# Patient Record
Sex: Female | Born: 1950 | Race: Black or African American | Hispanic: No | Marital: Married
Health system: Southern US, Community
[De-identification: ages and names within clinical notes are randomized; demographics above are authoritative.]

---

## 2006-09-08 ENCOUNTER — Encounter: Admission: RE | Admit: 2006-09-08 | Discharge: 2006-09-08 | Payer: Self-pay | Admitting: Internal Medicine

## 2007-06-23 ENCOUNTER — Encounter: Admission: RE | Admit: 2007-06-23 | Discharge: 2007-06-23 | Payer: Self-pay | Admitting: Internal Medicine

## 2009-05-30 ENCOUNTER — Encounter: Admission: RE | Admit: 2009-05-30 | Discharge: 2009-05-30 | Payer: Self-pay | Admitting: Neurosurgery

## 2010-04-12 ENCOUNTER — Inpatient Hospital Stay (HOSPITAL_COMMUNITY)
Admission: AD | Admit: 2010-04-12 | Discharge: 2010-04-17 | Disposition: A | Discharge status: Rehabilitation Facility | Payer: Self-pay | Admitting: Neurosurgery

## 2010-04-16 ENCOUNTER — Ambulatory Visit: Payer: Self-pay | Admitting: Physical Medicine & Rehabilitation

## 2010-04-17 ENCOUNTER — Inpatient Hospital Stay (HOSPITAL_COMMUNITY)
Admission: RE | Admit: 2010-04-17 | Discharge: 2010-04-20 | Payer: Self-pay | Admitting: Physical Medicine & Rehabilitation

## 2010-07-12 ENCOUNTER — Other Ambulatory Visit (HOSPITAL_COMMUNITY): Payer: Self-pay | Admitting: Specialist

## 2010-07-30 LAB — DIFFERENTIAL
Basophils Relative: 0 % (ref 0–1)
Eosinophils Relative: 3 % (ref 0–5)
Lymphocytes Relative: 35 % (ref 12–46)
Neutro Abs: 6.6 10*3/uL (ref 1.7–7.7)

## 2010-07-30 LAB — COMPREHENSIVE METABOLIC PANEL
ALT: 34 U/L (ref 0–35)
AST: 20 U/L (ref 0–37)
BUN: 14 mg/dL (ref 6–23)
Creatinine, Ser: 0.74 mg/dL (ref 0.4–1.2)
GFR calc Af Amer: >60 mL/min (ref >60)

## 2010-07-30 LAB — GLUCOSE, CAPILLARY
Glucose-Capillary: 121 mg/dL — ABNORMAL HIGH (ref 70–99)
Glucose-Capillary: 132 mg/dL — ABNORMAL HIGH (ref 70–99)
Glucose-Capillary: 133 mg/dL — ABNORMAL HIGH (ref 70–99)
Glucose-Capillary: 93 mg/dL (ref 70–99)

## 2010-07-30 LAB — CBC
HCT: 37.8 % (ref 36.0–46.0)
HCT: 40.7 % (ref 36.0–46.0)
Hemoglobin: 13.1 g/dL (ref 12.0–15.0)
MCHC: 32.3 g/dL (ref 30.0–36.0)
MCV: 86.8 fL (ref 78.0–100.0)
Platelets: 378 10*3/uL (ref 150–400)
RDW: 14.7 % (ref 11.5–15.5)
WBC: 11.8 10*3/uL — ABNORMAL HIGH (ref 4.0–10.5)

## 2010-07-30 LAB — MRSA PCR SCREENING: MRSA by PCR: NEGATIVE

## 2010-11-21 ENCOUNTER — Other Ambulatory Visit: Payer: Self-pay | Admitting: Neurosurgery

## 2010-11-21 DIAGNOSIS — M4712 Other spondylosis with myelopathy, cervical region: Secondary | ICD-10-CM

## 2010-11-26 ENCOUNTER — Ambulatory Visit
Admission: RE | Admit: 2010-11-26 | Discharge: 2010-11-26 | Disposition: A | Discharge status: Home / Self Care | Payer: BC Managed Care – PPO | Source: Ambulatory Visit | Attending: Neurosurgery | Admitting: Neurosurgery

## 2010-11-26 DIAGNOSIS — M4712 Other spondylosis with myelopathy, cervical region: Secondary | ICD-10-CM

## 2015-06-06 DIAGNOSIS — E559 Vitamin D deficiency, unspecified: Secondary | ICD-10-CM | POA: Diagnosis not present

## 2015-06-06 DIAGNOSIS — R5383 Other fatigue: Secondary | ICD-10-CM | POA: Diagnosis not present

## 2015-06-06 DIAGNOSIS — Z79899 Other long term (current) drug therapy: Secondary | ICD-10-CM | POA: Diagnosis not present

## 2015-06-06 DIAGNOSIS — E7800 Pure hypercholesterolemia, unspecified: Secondary | ICD-10-CM | POA: Diagnosis not present

## 2016-03-31 ENCOUNTER — Other Ambulatory Visit: Payer: Self-pay | Admitting: Nurse Practitioner

## 2016-03-31 DIAGNOSIS — N6310 Unspecified lump in the right breast, unspecified quadrant: Secondary | ICD-10-CM

## 2016-04-04 ENCOUNTER — Ambulatory Visit
Admission: RE | Admit: 2016-04-04 | Discharge: 2016-04-04 | Disposition: A | Discharge status: Home / Self Care | Payer: Medicare PPO | Source: Ambulatory Visit | Attending: Nurse Practitioner | Admitting: Nurse Practitioner

## 2016-04-04 DIAGNOSIS — N6310 Unspecified lump in the right breast, unspecified quadrant: Secondary | ICD-10-CM

## 2016-06-10 ENCOUNTER — Other Ambulatory Visit: Payer: Self-pay | Admitting: Internal Medicine

## 2016-06-10 DIAGNOSIS — Z1231 Encounter for screening mammogram for malignant neoplasm of breast: Secondary | ICD-10-CM

## 2016-06-26 ENCOUNTER — Ambulatory Visit
Admission: RE | Admit: 2016-06-26 | Discharge: 2016-06-26 | Disposition: A | Discharge status: Home / Self Care | Payer: Medicare PPO | Source: Ambulatory Visit | Attending: Internal Medicine | Admitting: Internal Medicine

## 2016-06-26 DIAGNOSIS — Z1231 Encounter for screening mammogram for malignant neoplasm of breast: Secondary | ICD-10-CM

## 2016-08-14 DIAGNOSIS — E2839 Other primary ovarian failure: Secondary | ICD-10-CM | POA: Diagnosis not present

## 2016-10-07 DIAGNOSIS — K5900 Constipation, unspecified: Secondary | ICD-10-CM | POA: Diagnosis not present

## 2016-10-09 DIAGNOSIS — M5430 Sciatica, unspecified side: Secondary | ICD-10-CM | POA: Diagnosis not present

## 2016-10-09 DIAGNOSIS — M791 Myalgia: Secondary | ICD-10-CM | POA: Diagnosis not present

## 2016-10-15 DIAGNOSIS — Z7982 Long term (current) use of aspirin: Secondary | ICD-10-CM | POA: Diagnosis not present

## 2016-10-15 DIAGNOSIS — E7800 Pure hypercholesterolemia, unspecified: Secondary | ICD-10-CM | POA: Diagnosis not present

## 2016-10-15 DIAGNOSIS — Z8249 Family history of ischemic heart disease and other diseases of the circulatory system: Secondary | ICD-10-CM | POA: Diagnosis not present

## 2016-10-15 DIAGNOSIS — M47812 Spondylosis without myelopathy or radiculopathy, cervical region: Secondary | ICD-10-CM | POA: Diagnosis not present

## 2016-10-15 DIAGNOSIS — Z886 Allergy status to analgesic agent status: Secondary | ICD-10-CM | POA: Diagnosis not present

## 2016-10-15 DIAGNOSIS — I10 Essential (primary) hypertension: Secondary | ICD-10-CM | POA: Diagnosis not present

## 2016-10-15 DIAGNOSIS — Z79899 Other long term (current) drug therapy: Secondary | ICD-10-CM | POA: Diagnosis not present

## 2016-10-15 DIAGNOSIS — K5900 Constipation, unspecified: Secondary | ICD-10-CM | POA: Diagnosis not present

## 2016-11-03 DIAGNOSIS — H2513 Age-related nuclear cataract, bilateral: Secondary | ICD-10-CM | POA: Diagnosis not present

## 2016-11-03 DIAGNOSIS — H52209 Unspecified astigmatism, unspecified eye: Secondary | ICD-10-CM | POA: Diagnosis not present

## 2016-11-03 DIAGNOSIS — H3550 Unspecified hereditary retinal dystrophy: Secondary | ICD-10-CM | POA: Diagnosis not present

## 2016-11-05 DIAGNOSIS — K5900 Constipation, unspecified: Secondary | ICD-10-CM | POA: Diagnosis not present

## 2017-05-25 ENCOUNTER — Other Ambulatory Visit: Payer: Self-pay | Admitting: Internal Medicine

## 2017-05-25 DIAGNOSIS — Z1231 Encounter for screening mammogram for malignant neoplasm of breast: Secondary | ICD-10-CM

## 2017-06-15 ENCOUNTER — Ambulatory Visit
Admission: RE | Admit: 2017-06-15 | Discharge: 2017-06-15 | Disposition: A | Discharge status: Home / Self Care | Payer: Medicare Other | Source: Ambulatory Visit | Attending: Internal Medicine | Admitting: Internal Medicine

## 2017-06-15 DIAGNOSIS — Z1231 Encounter for screening mammogram for malignant neoplasm of breast: Secondary | ICD-10-CM

## 2017-06-16 ENCOUNTER — Other Ambulatory Visit: Payer: Self-pay | Admitting: Internal Medicine

## 2017-06-16 DIAGNOSIS — N644 Mastodynia: Secondary | ICD-10-CM

## 2017-06-17 DIAGNOSIS — Z2821 Immunization not carried out because of patient refusal: Secondary | ICD-10-CM | POA: Diagnosis not present

## 2017-06-17 DIAGNOSIS — Z789 Other specified health status: Secondary | ICD-10-CM | POA: Diagnosis not present

## 2017-06-17 DIAGNOSIS — I10 Essential (primary) hypertension: Secondary | ICD-10-CM | POA: Diagnosis not present

## 2017-06-17 DIAGNOSIS — Z299 Encounter for prophylactic measures, unspecified: Secondary | ICD-10-CM | POA: Diagnosis not present

## 2017-06-17 DIAGNOSIS — E7800 Pure hypercholesterolemia, unspecified: Secondary | ICD-10-CM | POA: Diagnosis not present

## 2017-06-17 DIAGNOSIS — M542 Cervicalgia: Secondary | ICD-10-CM | POA: Diagnosis not present

## 2017-06-17 DIAGNOSIS — Z6836 Body mass index (BMI) 36.0-36.9, adult: Secondary | ICD-10-CM | POA: Diagnosis not present

## 2017-07-02 DIAGNOSIS — Z1339 Encounter for screening examination for other mental health and behavioral disorders: Secondary | ICD-10-CM | POA: Diagnosis not present

## 2017-07-02 DIAGNOSIS — Z1231 Encounter for screening mammogram for malignant neoplasm of breast: Secondary | ICD-10-CM | POA: Diagnosis not present

## 2017-07-02 DIAGNOSIS — Z6835 Body mass index (BMI) 35.0-35.9, adult: Secondary | ICD-10-CM | POA: Diagnosis not present

## 2017-07-02 DIAGNOSIS — Z7189 Other specified counseling: Secondary | ICD-10-CM | POA: Diagnosis not present

## 2017-07-02 DIAGNOSIS — Z1211 Encounter for screening for malignant neoplasm of colon: Secondary | ICD-10-CM | POA: Diagnosis not present

## 2017-07-02 DIAGNOSIS — Z299 Encounter for prophylactic measures, unspecified: Secondary | ICD-10-CM | POA: Diagnosis not present

## 2017-07-02 DIAGNOSIS — Z0000 Encounter for general adult medical examination without abnormal findings: Secondary | ICD-10-CM | POA: Diagnosis not present

## 2017-07-02 DIAGNOSIS — Z79899 Other long term (current) drug therapy: Secondary | ICD-10-CM | POA: Diagnosis not present

## 2017-07-02 DIAGNOSIS — I10 Essential (primary) hypertension: Secondary | ICD-10-CM | POA: Diagnosis not present

## 2017-07-02 DIAGNOSIS — Z1331 Encounter for screening for depression: Secondary | ICD-10-CM | POA: Diagnosis not present

## 2017-07-06 DIAGNOSIS — M961 Postlaminectomy syndrome, not elsewhere classified: Secondary | ICD-10-CM | POA: Diagnosis not present

## 2017-07-06 DIAGNOSIS — Z981 Arthrodesis status: Secondary | ICD-10-CM | POA: Diagnosis not present

## 2017-07-06 DIAGNOSIS — M50321 Other cervical disc degeneration at C4-C5 level: Secondary | ICD-10-CM | POA: Diagnosis not present

## 2017-07-08 ENCOUNTER — Ambulatory Visit
Admission: RE | Admit: 2017-07-08 | Discharge: 2017-07-08 | Disposition: A | Discharge status: Home / Self Care | Payer: Medicare Other | Source: Ambulatory Visit | Attending: Internal Medicine | Admitting: Internal Medicine

## 2017-07-08 ENCOUNTER — Other Ambulatory Visit: Payer: Self-pay | Admitting: Internal Medicine

## 2017-07-08 ENCOUNTER — Ambulatory Visit: Payer: Medicare Other

## 2017-07-08 DIAGNOSIS — N644 Mastodynia: Secondary | ICD-10-CM

## 2017-07-08 DIAGNOSIS — R928 Other abnormal and inconclusive findings on diagnostic imaging of breast: Secondary | ICD-10-CM | POA: Diagnosis not present

## 2017-07-14 DIAGNOSIS — M542 Cervicalgia: Secondary | ICD-10-CM | POA: Diagnosis not present

## 2017-07-14 DIAGNOSIS — M5030 Other cervical disc degeneration, unspecified cervical region: Secondary | ICD-10-CM | POA: Diagnosis not present

## 2017-07-20 DIAGNOSIS — Z981 Arthrodesis status: Secondary | ICD-10-CM | POA: Diagnosis not present

## 2017-08-22 DIAGNOSIS — M545 Low back pain: Secondary | ICD-10-CM | POA: Diagnosis not present

## 2017-09-29 DIAGNOSIS — Z299 Encounter for prophylactic measures, unspecified: Secondary | ICD-10-CM | POA: Diagnosis not present

## 2017-09-29 DIAGNOSIS — M545 Low back pain: Secondary | ICD-10-CM | POA: Diagnosis not present

## 2017-09-29 DIAGNOSIS — I700 Atherosclerosis of aorta: Secondary | ICD-10-CM | POA: Diagnosis not present

## 2017-09-29 DIAGNOSIS — M5386 Other specified dorsopathies, lumbar region: Secondary | ICD-10-CM | POA: Diagnosis not present

## 2017-09-29 DIAGNOSIS — R7989 Other specified abnormal findings of blood chemistry: Secondary | ICD-10-CM | POA: Diagnosis not present

## 2017-09-29 DIAGNOSIS — I10 Essential (primary) hypertension: Secondary | ICD-10-CM | POA: Diagnosis not present

## 2017-09-29 DIAGNOSIS — M47816 Spondylosis without myelopathy or radiculopathy, lumbar region: Secondary | ICD-10-CM | POA: Diagnosis not present

## 2017-09-29 DIAGNOSIS — M549 Dorsalgia, unspecified: Secondary | ICD-10-CM | POA: Diagnosis not present

## 2017-09-29 DIAGNOSIS — Z6836 Body mass index (BMI) 36.0-36.9, adult: Secondary | ICD-10-CM | POA: Diagnosis not present

## 2017-10-08 DIAGNOSIS — G514 Facial myokymia: Secondary | ICD-10-CM | POA: Diagnosis not present

## 2017-12-23 DIAGNOSIS — H35363 Drusen (degenerative) of macula, bilateral: Secondary | ICD-10-CM | POA: Diagnosis not present

## 2017-12-23 DIAGNOSIS — H524 Presbyopia: Secondary | ICD-10-CM | POA: Diagnosis not present

## 2017-12-23 DIAGNOSIS — D3132 Benign neoplasm of left choroid: Secondary | ICD-10-CM | POA: Diagnosis not present

## 2017-12-23 DIAGNOSIS — H5203 Hypermetropia, bilateral: Secondary | ICD-10-CM | POA: Diagnosis not present

## 2017-12-23 DIAGNOSIS — G514 Facial myokymia: Secondary | ICD-10-CM | POA: Diagnosis not present

## 2017-12-23 DIAGNOSIS — H43813 Vitreous degeneration, bilateral: Secondary | ICD-10-CM | POA: Diagnosis not present

## 2017-12-23 DIAGNOSIS — H52221 Regular astigmatism, right eye: Secondary | ICD-10-CM | POA: Diagnosis not present

## 2018-02-28 DIAGNOSIS — J069 Acute upper respiratory infection, unspecified: Secondary | ICD-10-CM | POA: Diagnosis not present

## 2018-02-28 DIAGNOSIS — J3089 Other allergic rhinitis: Secondary | ICD-10-CM | POA: Diagnosis not present

## 2018-02-28 DIAGNOSIS — J019 Acute sinusitis, unspecified: Secondary | ICD-10-CM | POA: Diagnosis not present

## 2018-02-28 DIAGNOSIS — H6121 Impacted cerumen, right ear: Secondary | ICD-10-CM | POA: Diagnosis not present

## 2018-03-24 DIAGNOSIS — Z6836 Body mass index (BMI) 36.0-36.9, adult: Secondary | ICD-10-CM | POA: Diagnosis not present

## 2018-03-24 DIAGNOSIS — E7800 Pure hypercholesterolemia, unspecified: Secondary | ICD-10-CM | POA: Diagnosis not present

## 2018-03-24 DIAGNOSIS — Z2821 Immunization not carried out because of patient refusal: Secondary | ICD-10-CM | POA: Diagnosis not present

## 2018-03-24 DIAGNOSIS — I10 Essential (primary) hypertension: Secondary | ICD-10-CM | POA: Diagnosis not present

## 2018-03-24 DIAGNOSIS — Z299 Encounter for prophylactic measures, unspecified: Secondary | ICD-10-CM | POA: Diagnosis not present

## 2018-03-24 DIAGNOSIS — N644 Mastodynia: Secondary | ICD-10-CM | POA: Diagnosis not present

## 2018-03-31 DIAGNOSIS — N644 Mastodynia: Secondary | ICD-10-CM | POA: Diagnosis not present

## 2018-04-27 DIAGNOSIS — I10 Essential (primary) hypertension: Secondary | ICD-10-CM | POA: Diagnosis not present

## 2018-04-27 DIAGNOSIS — G2581 Restless legs syndrome: Secondary | ICD-10-CM | POA: Diagnosis not present

## 2018-04-27 DIAGNOSIS — Z6836 Body mass index (BMI) 36.0-36.9, adult: Secondary | ICD-10-CM | POA: Diagnosis not present

## 2018-04-27 DIAGNOSIS — Z299 Encounter for prophylactic measures, unspecified: Secondary | ICD-10-CM | POA: Diagnosis not present

## 2018-04-27 DIAGNOSIS — N644 Mastodynia: Secondary | ICD-10-CM | POA: Diagnosis not present

## 2018-07-07 DIAGNOSIS — E559 Vitamin D deficiency, unspecified: Secondary | ICD-10-CM | POA: Diagnosis not present

## 2018-07-07 DIAGNOSIS — Z6834 Body mass index (BMI) 34.0-34.9, adult: Secondary | ICD-10-CM | POA: Diagnosis not present

## 2018-07-07 DIAGNOSIS — Z0000 Encounter for general adult medical examination without abnormal findings: Secondary | ICD-10-CM | POA: Diagnosis not present

## 2018-07-07 DIAGNOSIS — R5383 Other fatigue: Secondary | ICD-10-CM | POA: Diagnosis not present

## 2018-07-07 DIAGNOSIS — Z1339 Encounter for screening examination for other mental health and behavioral disorders: Secondary | ICD-10-CM | POA: Diagnosis not present

## 2018-07-07 DIAGNOSIS — Z1331 Encounter for screening for depression: Secondary | ICD-10-CM | POA: Diagnosis not present

## 2018-07-07 DIAGNOSIS — Z7189 Other specified counseling: Secondary | ICD-10-CM | POA: Diagnosis not present

## 2018-07-07 DIAGNOSIS — Z79899 Other long term (current) drug therapy: Secondary | ICD-10-CM | POA: Diagnosis not present

## 2018-07-07 DIAGNOSIS — Z1211 Encounter for screening for malignant neoplasm of colon: Secondary | ICD-10-CM | POA: Diagnosis not present

## 2018-07-07 DIAGNOSIS — Z299 Encounter for prophylactic measures, unspecified: Secondary | ICD-10-CM | POA: Diagnosis not present

## 2018-07-07 DIAGNOSIS — E7800 Pure hypercholesterolemia, unspecified: Secondary | ICD-10-CM | POA: Diagnosis not present

## 2018-09-24 DIAGNOSIS — Z299 Encounter for prophylactic measures, unspecified: Secondary | ICD-10-CM | POA: Diagnosis not present

## 2018-09-24 DIAGNOSIS — Z713 Dietary counseling and surveillance: Secondary | ICD-10-CM | POA: Diagnosis not present

## 2018-09-24 DIAGNOSIS — Z6834 Body mass index (BMI) 34.0-34.9, adult: Secondary | ICD-10-CM | POA: Diagnosis not present

## 2018-09-24 DIAGNOSIS — E7800 Pure hypercholesterolemia, unspecified: Secondary | ICD-10-CM | POA: Diagnosis not present

## 2018-09-24 DIAGNOSIS — I10 Essential (primary) hypertension: Secondary | ICD-10-CM | POA: Diagnosis not present

## 2018-10-25 DIAGNOSIS — E2839 Other primary ovarian failure: Secondary | ICD-10-CM | POA: Diagnosis not present

## 2018-11-16 ENCOUNTER — Other Ambulatory Visit: Payer: Self-pay | Admitting: Internal Medicine

## 2018-11-16 DIAGNOSIS — Z1231 Encounter for screening mammogram for malignant neoplasm of breast: Secondary | ICD-10-CM

## 2018-12-14 ENCOUNTER — Ambulatory Visit
Admission: RE | Admit: 2018-12-14 | Discharge: 2018-12-14 | Disposition: A | Discharge status: Home / Self Care | Payer: Medicare Other | Source: Ambulatory Visit | Attending: Internal Medicine | Admitting: Internal Medicine

## 2018-12-14 ENCOUNTER — Other Ambulatory Visit: Payer: Self-pay

## 2018-12-14 DIAGNOSIS — Z1231 Encounter for screening mammogram for malignant neoplasm of breast: Secondary | ICD-10-CM | POA: Diagnosis not present

## 2019-01-28 DIAGNOSIS — W57XXXA Bitten or stung by nonvenomous insect and other nonvenomous arthropods, initial encounter: Secondary | ICD-10-CM | POA: Diagnosis not present

## 2019-01-28 DIAGNOSIS — S80862A Insect bite (nonvenomous), left lower leg, initial encounter: Secondary | ICD-10-CM | POA: Diagnosis not present

## 2019-02-17 DIAGNOSIS — M545 Low back pain: Secondary | ICD-10-CM | POA: Diagnosis not present

## 2019-02-17 DIAGNOSIS — I10 Essential (primary) hypertension: Secondary | ICD-10-CM | POA: Diagnosis not present

## 2019-02-17 DIAGNOSIS — K5900 Constipation, unspecified: Secondary | ICD-10-CM | POA: Diagnosis not present

## 2019-02-17 DIAGNOSIS — Z6834 Body mass index (BMI) 34.0-34.9, adult: Secondary | ICD-10-CM | POA: Diagnosis not present

## 2019-02-17 DIAGNOSIS — Z299 Encounter for prophylactic measures, unspecified: Secondary | ICD-10-CM | POA: Diagnosis not present

## 2019-02-17 DIAGNOSIS — M19011 Primary osteoarthritis, right shoulder: Secondary | ICD-10-CM | POA: Diagnosis not present

## 2019-02-17 DIAGNOSIS — Z23 Encounter for immunization: Secondary | ICD-10-CM | POA: Diagnosis not present

## 2019-02-17 DIAGNOSIS — M25511 Pain in right shoulder: Secondary | ICD-10-CM | POA: Diagnosis not present

## 2019-02-17 DIAGNOSIS — M549 Dorsalgia, unspecified: Secondary | ICD-10-CM | POA: Diagnosis not present

## 2019-03-04 DIAGNOSIS — Z713 Dietary counseling and surveillance: Secondary | ICD-10-CM | POA: Diagnosis not present

## 2019-03-04 DIAGNOSIS — I10 Essential (primary) hypertension: Secondary | ICD-10-CM | POA: Diagnosis not present

## 2019-03-04 DIAGNOSIS — Z6836 Body mass index (BMI) 36.0-36.9, adult: Secondary | ICD-10-CM | POA: Diagnosis not present

## 2019-03-04 DIAGNOSIS — Z299 Encounter for prophylactic measures, unspecified: Secondary | ICD-10-CM | POA: Diagnosis not present

## 2019-03-04 DIAGNOSIS — M791 Myalgia, unspecified site: Secondary | ICD-10-CM | POA: Diagnosis not present

## 2019-06-06 DIAGNOSIS — M722 Plantar fascial fibromatosis: Secondary | ICD-10-CM | POA: Diagnosis not present

## 2019-06-06 DIAGNOSIS — M79672 Pain in left foot: Secondary | ICD-10-CM | POA: Diagnosis not present

## 2019-06-13 ENCOUNTER — Ambulatory Visit: Payer: Self-pay | Admitting: Nurse Practitioner

## 2019-07-11 DIAGNOSIS — Z1339 Encounter for screening examination for other mental health and behavioral disorders: Secondary | ICD-10-CM | POA: Diagnosis not present

## 2019-07-11 DIAGNOSIS — E559 Vitamin D deficiency, unspecified: Secondary | ICD-10-CM | POA: Diagnosis not present

## 2019-07-11 DIAGNOSIS — Z1211 Encounter for screening for malignant neoplasm of colon: Secondary | ICD-10-CM | POA: Diagnosis not present

## 2019-07-11 DIAGNOSIS — Z79899 Other long term (current) drug therapy: Secondary | ICD-10-CM | POA: Diagnosis not present

## 2019-07-11 DIAGNOSIS — Z6834 Body mass index (BMI) 34.0-34.9, adult: Secondary | ICD-10-CM | POA: Diagnosis not present

## 2019-07-11 DIAGNOSIS — Z299 Encounter for prophylactic measures, unspecified: Secondary | ICD-10-CM | POA: Diagnosis not present

## 2019-07-11 DIAGNOSIS — R7989 Other specified abnormal findings of blood chemistry: Secondary | ICD-10-CM | POA: Diagnosis not present

## 2019-07-11 DIAGNOSIS — Z1331 Encounter for screening for depression: Secondary | ICD-10-CM | POA: Diagnosis not present

## 2019-07-11 DIAGNOSIS — E7800 Pure hypercholesterolemia, unspecified: Secondary | ICD-10-CM | POA: Diagnosis not present

## 2019-07-11 DIAGNOSIS — Z7189 Other specified counseling: Secondary | ICD-10-CM | POA: Diagnosis not present

## 2019-07-11 DIAGNOSIS — Z0000 Encounter for general adult medical examination without abnormal findings: Secondary | ICD-10-CM | POA: Diagnosis not present

## 2019-07-11 DIAGNOSIS — I10 Essential (primary) hypertension: Secondary | ICD-10-CM | POA: Diagnosis not present

## 2019-07-11 DIAGNOSIS — R5383 Other fatigue: Secondary | ICD-10-CM | POA: Diagnosis not present

## 2019-07-12 ENCOUNTER — Other Ambulatory Visit: Payer: Self-pay | Admitting: Internal Medicine

## 2019-07-12 DIAGNOSIS — N644 Mastodynia: Secondary | ICD-10-CM

## 2019-07-17 ENCOUNTER — Ambulatory Visit: Payer: Medicare Other | Attending: Internal Medicine

## 2019-07-17 ENCOUNTER — Other Ambulatory Visit: Payer: Self-pay

## 2019-07-17 DIAGNOSIS — Z23 Encounter for immunization: Secondary | ICD-10-CM | POA: Insufficient documentation

## 2019-07-17 NOTE — Progress Notes (Signed)
   Covid-19 Vaccination Clinic  Name:  Lakeria Knabel    MRN: [id number] DOB: Apr 28, 1951  07/17/2019  Ms. Sek was observed post Covid-19 immunization for 15 minutes without incidence. She was provided with Vaccine Information Sheet and instruction to access the V-Safe system.   Ms. Hartke was instructed to call 911 with any severe reactions post vaccine: Marland Kitchen Difficulty breathing  . Swelling of your face and throat  . A fast heartbeat  . A bad rash all over your body  . Dizziness and weakness    Immunizations Administered    Name Date Dose VIS Date Route   Pfizer COVID-19 Vaccine 07/17/2019 12:58 PM 0.3 mL 04/29/2019 Intramuscular   Manufacturer: [place]   Lot: [id number]   [place]: [id number]

## 2019-08-08 ENCOUNTER — Ambulatory Visit
Admission: RE | Admit: 2019-08-08 | Discharge: 2019-08-08 | Disposition: A | Discharge status: Home / Self Care | Payer: Medicare Other | Source: Ambulatory Visit | Attending: Internal Medicine | Admitting: Internal Medicine

## 2019-08-08 ENCOUNTER — Other Ambulatory Visit: Payer: Self-pay

## 2019-08-08 DIAGNOSIS — R928 Other abnormal and inconclusive findings on diagnostic imaging of breast: Secondary | ICD-10-CM | POA: Diagnosis not present

## 2019-08-08 DIAGNOSIS — N644 Mastodynia: Secondary | ICD-10-CM

## 2019-08-12 DIAGNOSIS — Z6834 Body mass index (BMI) 34.0-34.9, adult: Secondary | ICD-10-CM | POA: Diagnosis not present

## 2019-08-12 DIAGNOSIS — I10 Essential (primary) hypertension: Secondary | ICD-10-CM | POA: Diagnosis not present

## 2019-08-12 DIAGNOSIS — Z299 Encounter for prophylactic measures, unspecified: Secondary | ICD-10-CM | POA: Diagnosis not present

## 2019-08-12 DIAGNOSIS — N644 Mastodynia: Secondary | ICD-10-CM | POA: Diagnosis not present

## 2019-08-12 DIAGNOSIS — Z713 Dietary counseling and surveillance: Secondary | ICD-10-CM | POA: Diagnosis not present

## 2019-08-12 DIAGNOSIS — Z789 Other specified health status: Secondary | ICD-10-CM | POA: Diagnosis not present

## 2019-08-17 ENCOUNTER — Ambulatory Visit: Payer: Medicare Other | Attending: Internal Medicine

## 2019-08-17 DIAGNOSIS — Z23 Encounter for immunization: Secondary | ICD-10-CM

## 2019-08-17 NOTE — Progress Notes (Signed)
   Covid-19 Vaccination Clinic  Name:  Jasmen Devane    MRN: [id number] DOB: 11/10/1950  08/17/2019  Ms. Willow was observed post Covid-19 immunization for 15 minutes without incident. She was provided with Vaccine Information Sheet and instruction to access the V-Safe system.   Ms. Bingenheimer was instructed to call 911 with any severe reactions post vaccine: Marland Kitchen Difficulty breathing  . Swelling of face and throat  . A fast heartbeat  . A bad rash all over body  . Dizziness and weakness   Immunizations Administered    Name Date Dose VIS Date Route   Pfizer COVID-19 Vaccine 08/17/2019  8:25 AM 0.3 mL 04/29/2019 Intramuscular   Manufacturer: [employer]   Lot: [id number]   [place]: [id number]

## 2019-09-19 DIAGNOSIS — H52221 Regular astigmatism, right eye: Secondary | ICD-10-CM | POA: Diagnosis not present

## 2019-09-19 DIAGNOSIS — D3132 Benign neoplasm of left choroid: Secondary | ICD-10-CM | POA: Diagnosis not present

## 2019-09-19 DIAGNOSIS — H2513 Age-related nuclear cataract, bilateral: Secondary | ICD-10-CM | POA: Diagnosis not present

## 2019-09-19 DIAGNOSIS — H5203 Hypermetropia, bilateral: Secondary | ICD-10-CM | POA: Diagnosis not present

## 2019-09-19 DIAGNOSIS — H524 Presbyopia: Secondary | ICD-10-CM | POA: Diagnosis not present

## 2019-09-19 DIAGNOSIS — H43813 Vitreous degeneration, bilateral: Secondary | ICD-10-CM | POA: Diagnosis not present

## 2019-09-19 DIAGNOSIS — H35363 Drusen (degenerative) of macula, bilateral: Secondary | ICD-10-CM | POA: Diagnosis not present

## 2019-10-20 DIAGNOSIS — I10 Essential (primary) hypertension: Secondary | ICD-10-CM | POA: Diagnosis not present

## 2019-10-20 DIAGNOSIS — E7800 Pure hypercholesterolemia, unspecified: Secondary | ICD-10-CM | POA: Diagnosis not present

## 2019-10-20 DIAGNOSIS — M549 Dorsalgia, unspecified: Secondary | ICD-10-CM | POA: Diagnosis not present

## 2019-10-20 DIAGNOSIS — Z299 Encounter for prophylactic measures, unspecified: Secondary | ICD-10-CM | POA: Diagnosis not present

## 2020-01-04 DIAGNOSIS — Z6834 Body mass index (BMI) 34.0-34.9, adult: Secondary | ICD-10-CM | POA: Diagnosis not present

## 2020-01-04 DIAGNOSIS — E7800 Pure hypercholesterolemia, unspecified: Secondary | ICD-10-CM | POA: Diagnosis not present

## 2020-01-04 DIAGNOSIS — N390 Urinary tract infection, site not specified: Secondary | ICD-10-CM | POA: Diagnosis not present

## 2020-01-04 DIAGNOSIS — R1030 Lower abdominal pain, unspecified: Secondary | ICD-10-CM | POA: Diagnosis not present

## 2020-01-04 DIAGNOSIS — I10 Essential (primary) hypertension: Secondary | ICD-10-CM | POA: Diagnosis not present

## 2020-01-04 DIAGNOSIS — Z299 Encounter for prophylactic measures, unspecified: Secondary | ICD-10-CM | POA: Diagnosis not present

## 2020-01-17 DIAGNOSIS — D250 Submucous leiomyoma of uterus: Secondary | ICD-10-CM | POA: Diagnosis not present

## 2020-01-17 DIAGNOSIS — E7800 Pure hypercholesterolemia, unspecified: Secondary | ICD-10-CM | POA: Diagnosis not present

## 2020-01-17 DIAGNOSIS — E559 Vitamin D deficiency, unspecified: Secondary | ICD-10-CM | POA: Diagnosis not present

## 2020-01-17 DIAGNOSIS — I10 Essential (primary) hypertension: Secondary | ICD-10-CM | POA: Diagnosis not present

## 2020-01-17 DIAGNOSIS — M549 Dorsalgia, unspecified: Secondary | ICD-10-CM | POA: Diagnosis not present

## 2020-01-17 DIAGNOSIS — R109 Unspecified abdominal pain: Secondary | ICD-10-CM | POA: Diagnosis not present

## 2020-01-17 DIAGNOSIS — R9389 Abnormal findings on diagnostic imaging of other specified body structures: Secondary | ICD-10-CM | POA: Diagnosis not present

## 2020-01-17 DIAGNOSIS — Z299 Encounter for prophylactic measures, unspecified: Secondary | ICD-10-CM | POA: Diagnosis not present

## 2020-01-17 DIAGNOSIS — L700 Acne vulgaris: Secondary | ICD-10-CM | POA: Diagnosis not present

## 2020-01-25 DIAGNOSIS — R9389 Abnormal findings on diagnostic imaging of other specified body structures: Secondary | ICD-10-CM | POA: Diagnosis not present

## 2020-01-25 DIAGNOSIS — E7800 Pure hypercholesterolemia, unspecified: Secondary | ICD-10-CM | POA: Diagnosis not present

## 2020-01-25 DIAGNOSIS — Z299 Encounter for prophylactic measures, unspecified: Secondary | ICD-10-CM | POA: Diagnosis not present

## 2020-01-25 DIAGNOSIS — I10 Essential (primary) hypertension: Secondary | ICD-10-CM | POA: Diagnosis not present

## 2020-01-25 DIAGNOSIS — D259 Leiomyoma of uterus, unspecified: Secondary | ICD-10-CM | POA: Diagnosis not present

## 2020-01-25 DIAGNOSIS — Z6834 Body mass index (BMI) 34.0-34.9, adult: Secondary | ICD-10-CM | POA: Diagnosis not present

## 2020-02-16 DIAGNOSIS — I10 Essential (primary) hypertension: Secondary | ICD-10-CM | POA: Diagnosis not present

## 2020-02-16 DIAGNOSIS — E559 Vitamin D deficiency, unspecified: Secondary | ICD-10-CM | POA: Diagnosis not present

## 2020-02-16 DIAGNOSIS — E7849 Other hyperlipidemia: Secondary | ICD-10-CM | POA: Diagnosis not present

## 2020-02-21 DIAGNOSIS — Z124 Encounter for screening for malignant neoplasm of cervix: Secondary | ICD-10-CM | POA: Diagnosis not present

## 2020-02-21 DIAGNOSIS — R9389 Abnormal findings on diagnostic imaging of other specified body structures: Secondary | ICD-10-CM | POA: Diagnosis not present

## 2020-03-01 DIAGNOSIS — R9389 Abnormal findings on diagnostic imaging of other specified body structures: Secondary | ICD-10-CM | POA: Diagnosis not present

## 2020-03-16 DIAGNOSIS — I10 Essential (primary) hypertension: Secondary | ICD-10-CM | POA: Diagnosis not present

## 2020-03-16 DIAGNOSIS — E559 Vitamin D deficiency, unspecified: Secondary | ICD-10-CM | POA: Diagnosis not present

## 2020-03-16 DIAGNOSIS — E7849 Other hyperlipidemia: Secondary | ICD-10-CM | POA: Diagnosis not present

## 2020-03-28 DIAGNOSIS — R9389 Abnormal findings on diagnostic imaging of other specified body structures: Secondary | ICD-10-CM | POA: Diagnosis not present

## 2020-03-28 DIAGNOSIS — Z01818 Encounter for other preprocedural examination: Secondary | ICD-10-CM | POA: Diagnosis not present

## 2020-03-30 DIAGNOSIS — R9389 Abnormal findings on diagnostic imaging of other specified body structures: Secondary | ICD-10-CM | POA: Diagnosis not present

## 2020-03-30 DIAGNOSIS — Z79899 Other long term (current) drug therapy: Secondary | ICD-10-CM | POA: Diagnosis not present

## 2020-03-30 DIAGNOSIS — N858 Other specified noninflammatory disorders of uterus: Secondary | ICD-10-CM | POA: Diagnosis not present

## 2020-03-30 DIAGNOSIS — I10 Essential (primary) hypertension: Secondary | ICD-10-CM | POA: Diagnosis not present

## 2020-03-30 DIAGNOSIS — Z7982 Long term (current) use of aspirin: Secondary | ICD-10-CM | POA: Diagnosis not present

## 2020-03-30 DIAGNOSIS — E785 Hyperlipidemia, unspecified: Secondary | ICD-10-CM | POA: Diagnosis not present

## 2020-04-05 DIAGNOSIS — Z09 Encounter for follow-up examination after completed treatment for conditions other than malignant neoplasm: Secondary | ICD-10-CM | POA: Diagnosis not present

## 2020-04-05 DIAGNOSIS — R9389 Abnormal findings on diagnostic imaging of other specified body structures: Secondary | ICD-10-CM | POA: Diagnosis not present

## 2020-05-18 DIAGNOSIS — E559 Vitamin D deficiency, unspecified: Secondary | ICD-10-CM | POA: Diagnosis not present

## 2020-05-18 DIAGNOSIS — E7849 Other hyperlipidemia: Secondary | ICD-10-CM | POA: Diagnosis not present

## 2020-05-18 DIAGNOSIS — I10 Essential (primary) hypertension: Secondary | ICD-10-CM | POA: Diagnosis not present

## 2020-06-30 IMAGING — MG MM DIGITAL DIAGNOSTIC UNILAT*R* W/ TOMO W/ CAD
6 series · 6 of 18 positions shown · non-contrast
Comparison: Previous exam(s).

CLINICAL DATA: 69-year-old with focal pain involving the INNER
RIGHT breast. The patient has had recurrent episodes of focal pain
in this location.

EXAM:
DIGITAL DIAGNOSTIC RIGHT MAMMOGRAM WITH CAD AND TOMO
ULTRASOUND RIGHT BREAST

[R CC synth-2D]
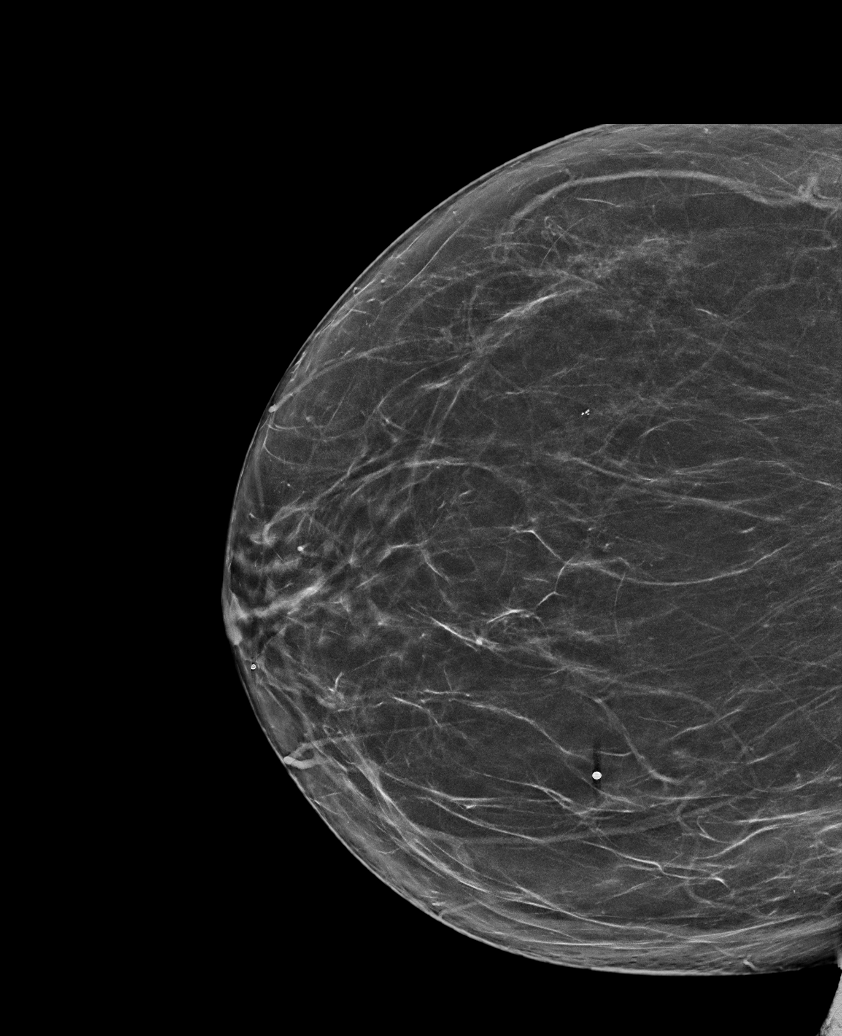

[R MLO synth-2D (1 of 2)]
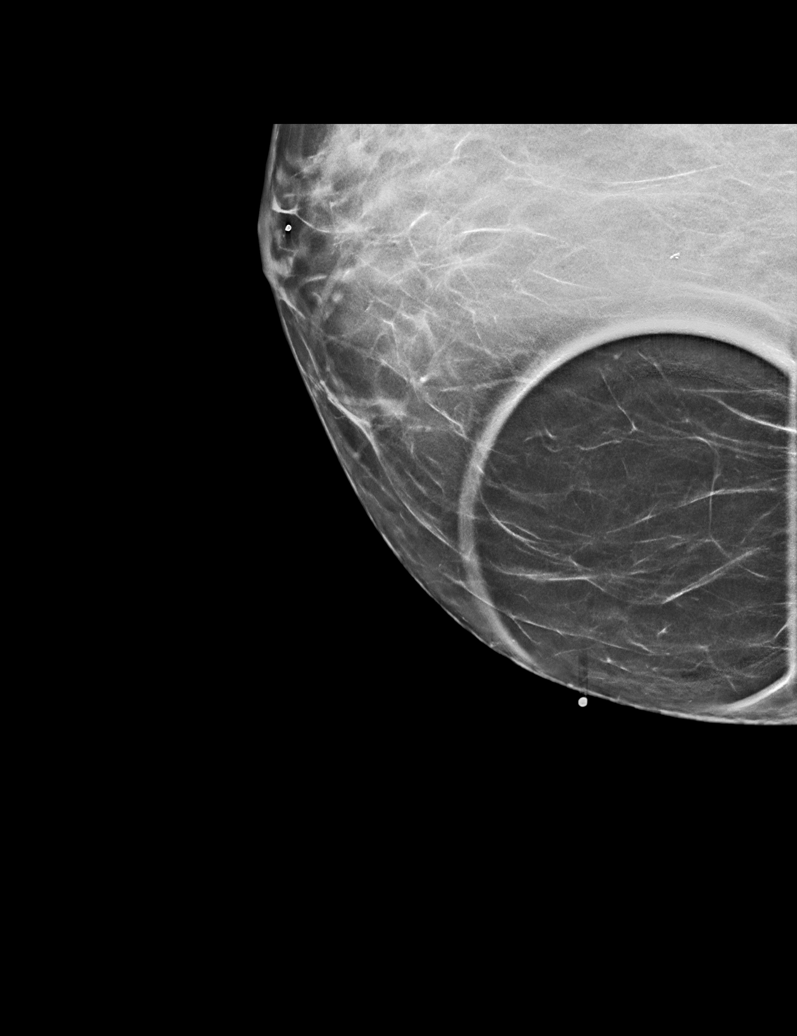

[R MLO synth-2D (2 of 2)]
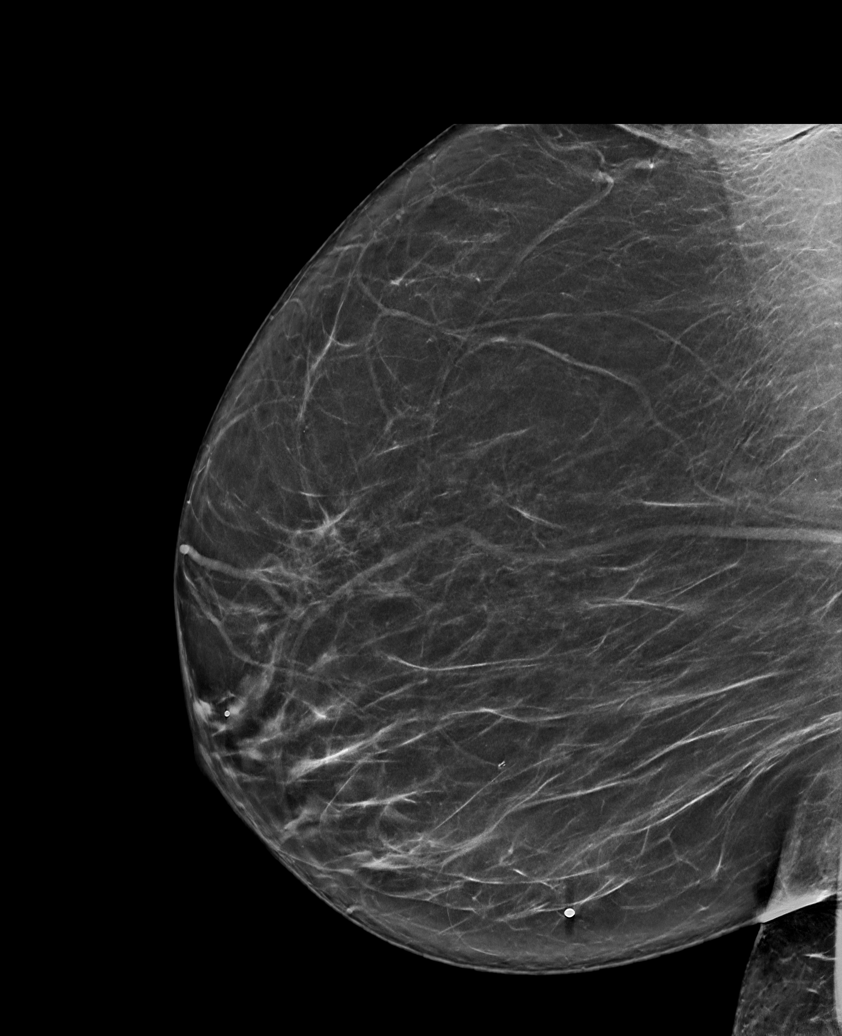

[R MLO tomo (1 of 2) · tomo slice 29/58.0]
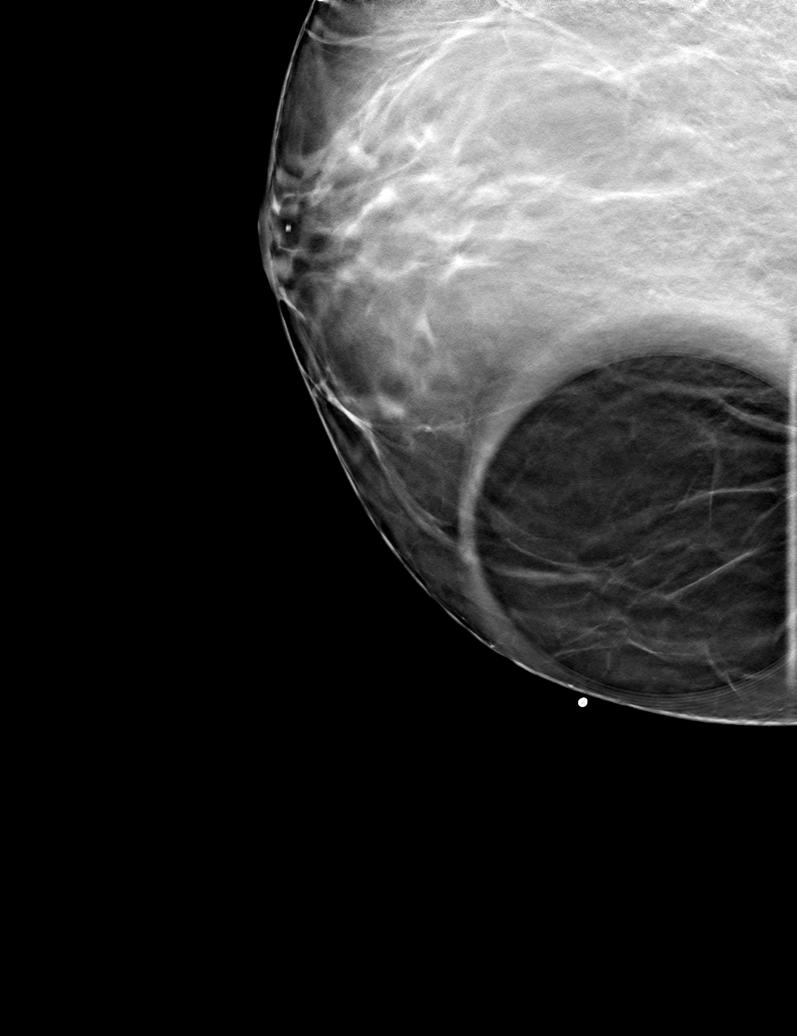

[R CC tomo · tomo slice 39/76.0]
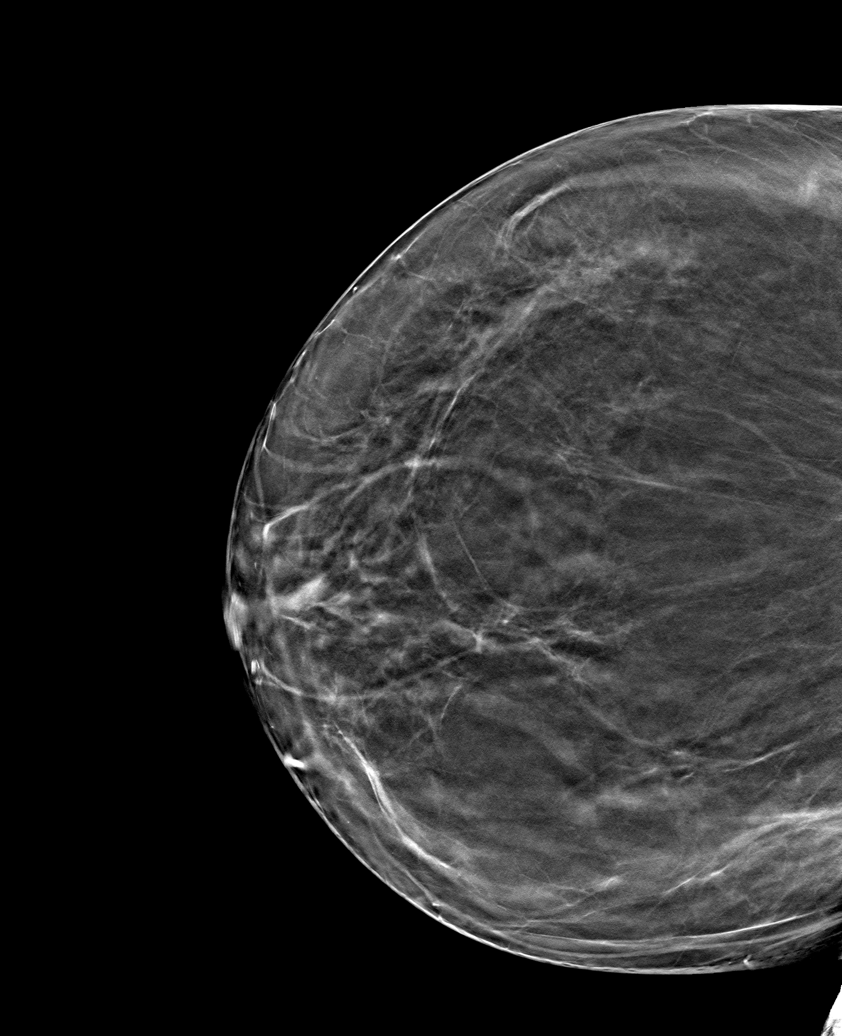

[R MLO tomo (2 of 2) · tomo slice 41/82.0]
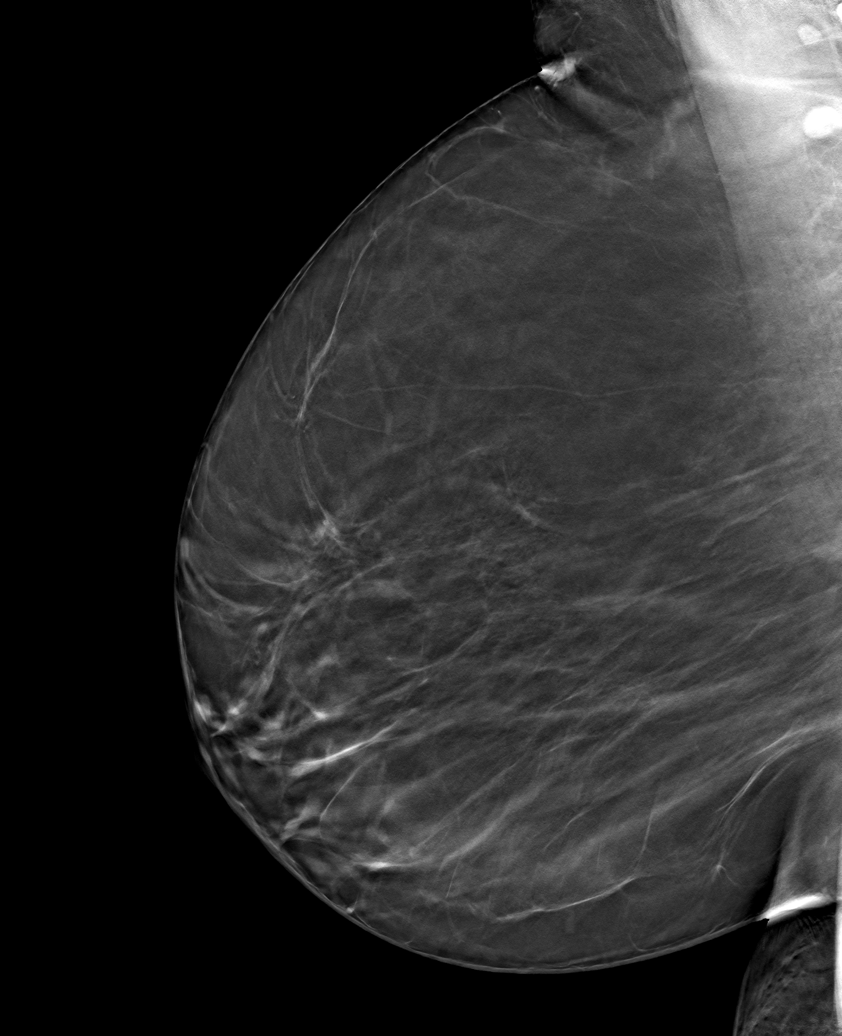

[6 of 18 positions shown; findings below may reference images not displayed]

ACR Breast Density Category b: There are scattered areas of
fibroglandular density.
FINDINGS: Tomosynthesis and synthesized full field CC and MLO views of the
RIGHT breast were obtained. Tomosynthesis and synthesized spot
compression tangential view of the area of concern in the RIGHT
breast was also obtained.

No findings suspicious for malignancy in the RIGHT breast.
Specifically, no mammographic abnormalities in the INNER breast in
the area of focal pain.

Mammographic images were processed with CAD.

On correlative physical exam, there is no palpable abnormality in
the LOWER INNER RIGHT breast, though the patient describes
tenderness to palpation.

Targeted RIGHT breast ultrasound is performed, showing normal
scattered fibroglandular tissue at the 4 o'clock position
approximately 8 cm from the nipple in the area of focal pain. No
cyst, solid mass or abnormal acoustic shadowing is identified.
IMPRESSION: No mammographic or sonographic evidence of malignancy involving the
RIGHT breast.

RECOMMENDATION:
Screening mammogram in one year.(Code:[id number])

I have discussed the findings and recommendations with the patient.
If applicable, a reminder letter will be sent to the patient
regarding the next appointment.

BI-RADS CATEGORY  1: Negative.

## 2020-06-30 IMAGING — US US BREAST*R* LIMITED INC AXILLA
1 series · 5 of 5 positions shown · non-contrast
Comparison: Previous exam(s).

CLINICAL DATA: 69-year-old with focal pain involving the INNER
RIGHT breast. The patient has had recurrent episodes of focal pain
in this location.

EXAM:
DIGITAL DIAGNOSTIC RIGHT MAMMOGRAM WITH CAD AND TOMO
ULTRASOUND RIGHT BREAST

[Series 1: us breast*right* limited inc axilla · 0.08mm/px · 5 of 5 slices shown]
[im 1/5]
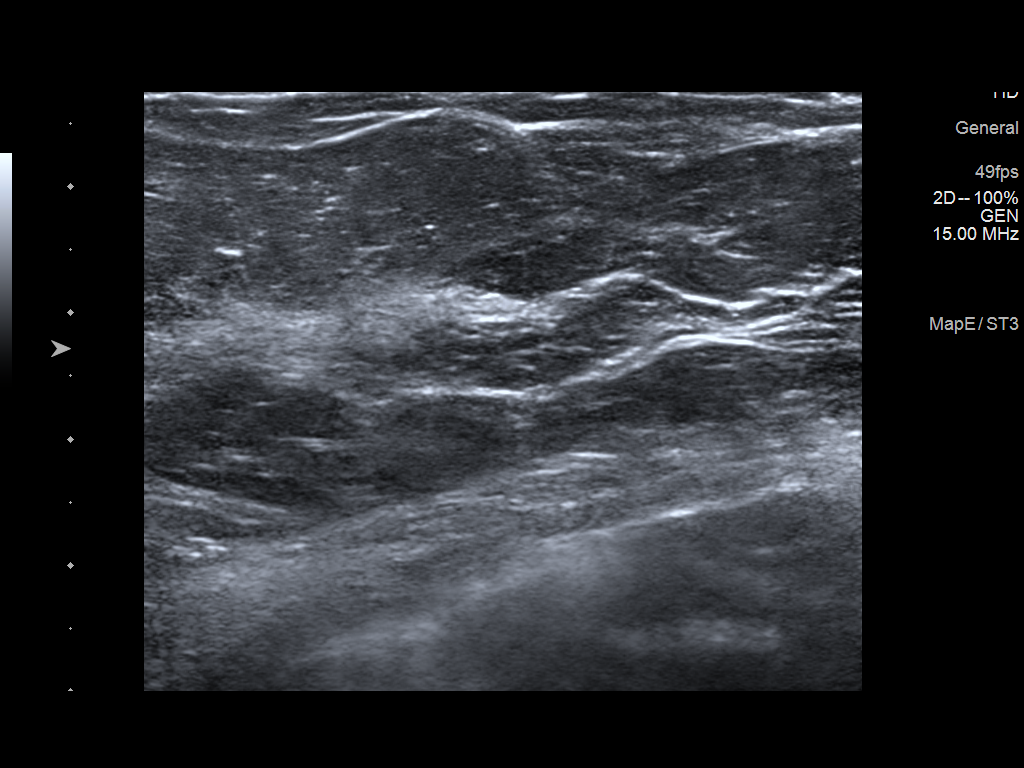
[im 2/5]
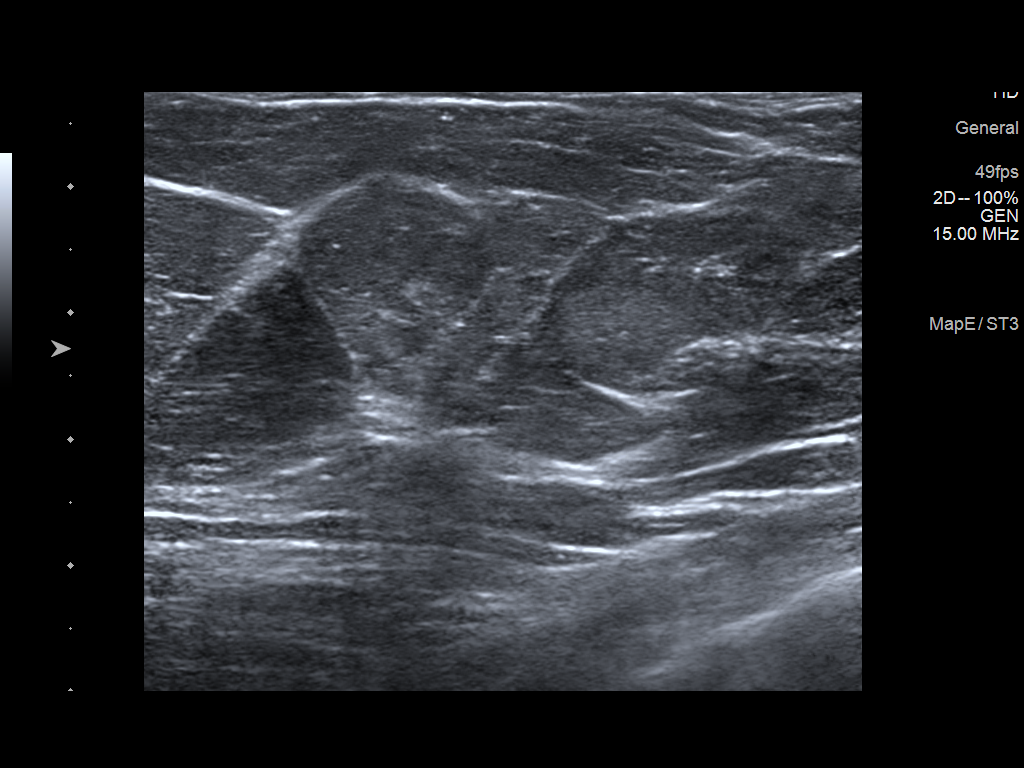
[im 3/5]
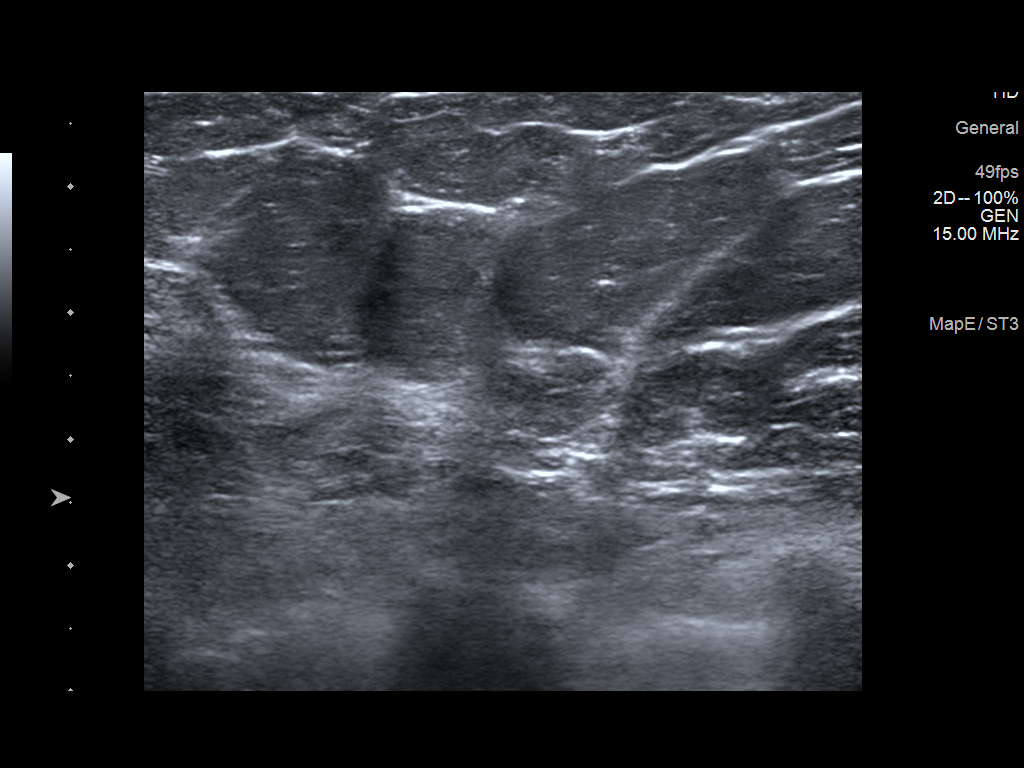
[im 4/5]
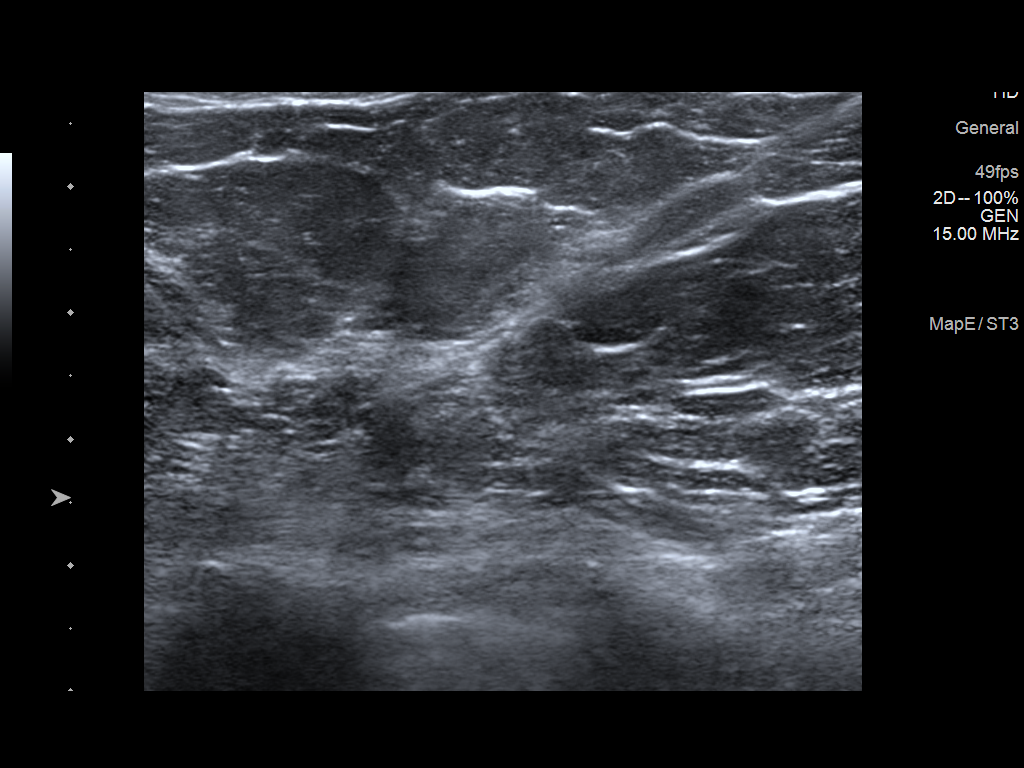
[im 5/5]
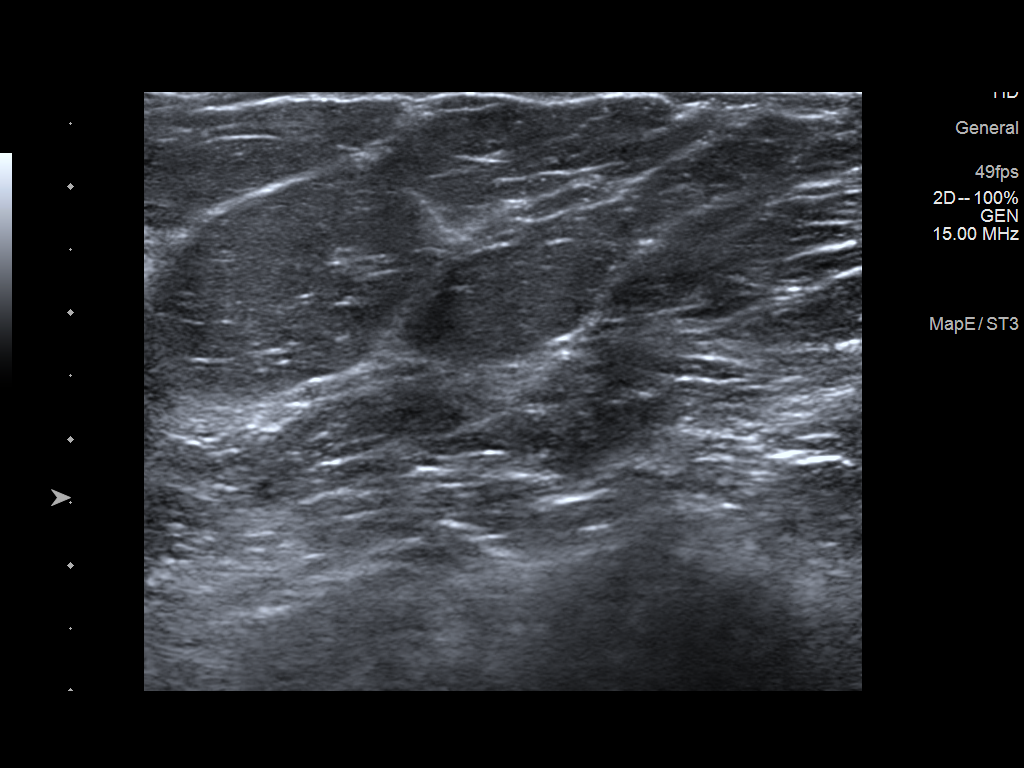

[5 of 5 positions shown; findings below may reference images not displayed]

ACR Breast Density Category b: There are scattered areas of
fibroglandular density.
FINDINGS: Tomosynthesis and synthesized full field CC and MLO views of the
RIGHT breast were obtained. Tomosynthesis and synthesized spot
compression tangential view of the area of concern in the RIGHT
breast was also obtained.

No findings suspicious for malignancy in the RIGHT breast.
Specifically, no mammographic abnormalities in the INNER breast in
the area of focal pain.

Mammographic images were processed with CAD.

On correlative physical exam, there is no palpable abnormality in
the LOWER INNER RIGHT breast, though the patient describes
tenderness to palpation.

Targeted RIGHT breast ultrasound is performed, showing normal
scattered fibroglandular tissue at the 4 o'clock position
approximately 8 cm from the nipple in the area of focal pain. No
cyst, solid mass or abnormal acoustic shadowing is identified.
IMPRESSION: No mammographic or sonographic evidence of malignancy involving the
RIGHT breast.

RECOMMENDATION:
Screening mammogram in one year.(Code:[id number])

I have discussed the findings and recommendations with the patient.
If applicable, a reminder letter will be sent to the patient
regarding the next appointment.

BI-RADS CATEGORY  1: Negative.

## 2020-07-11 DIAGNOSIS — E559 Vitamin D deficiency, unspecified: Secondary | ICD-10-CM | POA: Diagnosis not present

## 2020-07-11 DIAGNOSIS — Z79899 Other long term (current) drug therapy: Secondary | ICD-10-CM | POA: Diagnosis not present

## 2020-07-11 DIAGNOSIS — Z1339 Encounter for screening examination for other mental health and behavioral disorders: Secondary | ICD-10-CM | POA: Diagnosis not present

## 2020-07-11 DIAGNOSIS — R5383 Other fatigue: Secondary | ICD-10-CM | POA: Diagnosis not present

## 2020-07-11 DIAGNOSIS — Z1331 Encounter for screening for depression: Secondary | ICD-10-CM | POA: Diagnosis not present

## 2020-07-11 DIAGNOSIS — Z0000 Encounter for general adult medical examination without abnormal findings: Secondary | ICD-10-CM | POA: Diagnosis not present

## 2020-07-11 DIAGNOSIS — Z6834 Body mass index (BMI) 34.0-34.9, adult: Secondary | ICD-10-CM | POA: Diagnosis not present

## 2020-07-11 DIAGNOSIS — Z299 Encounter for prophylactic measures, unspecified: Secondary | ICD-10-CM | POA: Diagnosis not present

## 2020-07-11 DIAGNOSIS — M7060 Trochanteric bursitis, unspecified hip: Secondary | ICD-10-CM | POA: Diagnosis not present

## 2020-07-11 DIAGNOSIS — Z7189 Other specified counseling: Secondary | ICD-10-CM | POA: Diagnosis not present

## 2020-07-11 DIAGNOSIS — E7800 Pure hypercholesterolemia, unspecified: Secondary | ICD-10-CM | POA: Diagnosis not present

## 2020-07-16 ENCOUNTER — Other Ambulatory Visit: Payer: Self-pay | Admitting: Internal Medicine

## 2020-07-16 DIAGNOSIS — I10 Essential (primary) hypertension: Secondary | ICD-10-CM | POA: Diagnosis not present

## 2020-07-16 DIAGNOSIS — Z1231 Encounter for screening mammogram for malignant neoplasm of breast: Secondary | ICD-10-CM

## 2020-07-16 DIAGNOSIS — E7849 Other hyperlipidemia: Secondary | ICD-10-CM | POA: Diagnosis not present

## 2020-07-16 DIAGNOSIS — E559 Vitamin D deficiency, unspecified: Secondary | ICD-10-CM | POA: Diagnosis not present

## 2020-08-02 DIAGNOSIS — L309 Dermatitis, unspecified: Secondary | ICD-10-CM | POA: Diagnosis not present

## 2020-08-02 DIAGNOSIS — Z299 Encounter for prophylactic measures, unspecified: Secondary | ICD-10-CM | POA: Diagnosis not present

## 2020-08-02 DIAGNOSIS — I10 Essential (primary) hypertension: Secondary | ICD-10-CM | POA: Diagnosis not present

## 2020-08-02 DIAGNOSIS — Z2821 Immunization not carried out because of patient refusal: Secondary | ICD-10-CM | POA: Diagnosis not present

## 2020-08-02 DIAGNOSIS — Z789 Other specified health status: Secondary | ICD-10-CM | POA: Diagnosis not present

## 2020-08-15 DIAGNOSIS — E559 Vitamin D deficiency, unspecified: Secondary | ICD-10-CM | POA: Diagnosis not present

## 2020-08-15 DIAGNOSIS — E7849 Other hyperlipidemia: Secondary | ICD-10-CM | POA: Diagnosis not present

## 2020-08-15 DIAGNOSIS — I10 Essential (primary) hypertension: Secondary | ICD-10-CM | POA: Diagnosis not present

## 2020-08-22 DIAGNOSIS — Z6834 Body mass index (BMI) 34.0-34.9, adult: Secondary | ICD-10-CM | POA: Diagnosis not present

## 2020-08-22 DIAGNOSIS — M25551 Pain in right hip: Secondary | ICD-10-CM | POA: Diagnosis not present

## 2020-08-22 DIAGNOSIS — Z713 Dietary counseling and surveillance: Secondary | ICD-10-CM | POA: Diagnosis not present

## 2020-08-22 DIAGNOSIS — I10 Essential (primary) hypertension: Secondary | ICD-10-CM | POA: Diagnosis not present

## 2020-08-22 DIAGNOSIS — Z299 Encounter for prophylactic measures, unspecified: Secondary | ICD-10-CM | POA: Diagnosis not present

## 2020-09-05 ENCOUNTER — Ambulatory Visit
Admission: RE | Admit: 2020-09-05 | Discharge: 2020-09-05 | Disposition: A | Discharge status: Home / Self Care | Payer: Medicare Other | Source: Ambulatory Visit | Attending: Internal Medicine | Admitting: Internal Medicine

## 2020-09-05 ENCOUNTER — Other Ambulatory Visit: Payer: Self-pay

## 2020-09-05 DIAGNOSIS — Z1231 Encounter for screening mammogram for malignant neoplasm of breast: Secondary | ICD-10-CM

## 2020-10-21 DIAGNOSIS — J189 Pneumonia, unspecified organism: Secondary | ICD-10-CM | POA: Diagnosis not present

## 2020-10-21 DIAGNOSIS — U071 COVID-19: Secondary | ICD-10-CM | POA: Diagnosis not present

## 2020-10-21 DIAGNOSIS — I10 Essential (primary) hypertension: Secondary | ICD-10-CM | POA: Diagnosis not present

## 2020-10-21 DIAGNOSIS — J069 Acute upper respiratory infection, unspecified: Secondary | ICD-10-CM | POA: Diagnosis not present

## 2020-10-21 DIAGNOSIS — R059 Cough, unspecified: Secondary | ICD-10-CM | POA: Diagnosis not present

## 2020-11-13 DIAGNOSIS — E2839 Other primary ovarian failure: Secondary | ICD-10-CM | POA: Diagnosis not present

## 2020-11-15 DIAGNOSIS — I10 Essential (primary) hypertension: Secondary | ICD-10-CM | POA: Diagnosis not present

## 2020-11-15 DIAGNOSIS — E1165 Type 2 diabetes mellitus with hyperglycemia: Secondary | ICD-10-CM | POA: Diagnosis not present

## 2020-12-16 DIAGNOSIS — I10 Essential (primary) hypertension: Secondary | ICD-10-CM | POA: Diagnosis not present

## 2020-12-16 DIAGNOSIS — E1165 Type 2 diabetes mellitus with hyperglycemia: Secondary | ICD-10-CM | POA: Diagnosis not present

## 2020-12-24 DIAGNOSIS — I10 Essential (primary) hypertension: Secondary | ICD-10-CM | POA: Diagnosis not present

## 2020-12-24 DIAGNOSIS — R350 Frequency of micturition: Secondary | ICD-10-CM | POA: Diagnosis not present

## 2020-12-24 DIAGNOSIS — M1710 Unilateral primary osteoarthritis, unspecified knee: Secondary | ICD-10-CM | POA: Diagnosis not present

## 2020-12-24 DIAGNOSIS — N390 Urinary tract infection, site not specified: Secondary | ICD-10-CM | POA: Diagnosis not present

## 2020-12-24 DIAGNOSIS — Z6834 Body mass index (BMI) 34.0-34.9, adult: Secondary | ICD-10-CM | POA: Diagnosis not present

## 2020-12-24 DIAGNOSIS — Z299 Encounter for prophylactic measures, unspecified: Secondary | ICD-10-CM | POA: Diagnosis not present

## 2021-02-06 DIAGNOSIS — K5900 Constipation, unspecified: Secondary | ICD-10-CM | POA: Diagnosis not present

## 2021-02-06 DIAGNOSIS — Z299 Encounter for prophylactic measures, unspecified: Secondary | ICD-10-CM | POA: Diagnosis not present

## 2021-02-06 DIAGNOSIS — R350 Frequency of micturition: Secondary | ICD-10-CM | POA: Diagnosis not present

## 2021-02-06 DIAGNOSIS — Z6834 Body mass index (BMI) 34.0-34.9, adult: Secondary | ICD-10-CM | POA: Diagnosis not present

## 2021-02-06 DIAGNOSIS — M791 Myalgia, unspecified site: Secondary | ICD-10-CM | POA: Diagnosis not present

## 2021-02-06 DIAGNOSIS — M25562 Pain in left knee: Secondary | ICD-10-CM | POA: Diagnosis not present

## 2021-03-11 DIAGNOSIS — M79605 Pain in left leg: Secondary | ICD-10-CM | POA: Diagnosis not present

## 2021-03-11 DIAGNOSIS — Z299 Encounter for prophylactic measures, unspecified: Secondary | ICD-10-CM | POA: Diagnosis not present

## 2021-03-11 DIAGNOSIS — E7800 Pure hypercholesterolemia, unspecified: Secondary | ICD-10-CM | POA: Diagnosis not present

## 2021-03-11 DIAGNOSIS — Z23 Encounter for immunization: Secondary | ICD-10-CM | POA: Diagnosis not present

## 2021-03-11 DIAGNOSIS — M1710 Unilateral primary osteoarthritis, unspecified knee: Secondary | ICD-10-CM | POA: Diagnosis not present

## 2021-03-11 DIAGNOSIS — Z6834 Body mass index (BMI) 34.0-34.9, adult: Secondary | ICD-10-CM | POA: Diagnosis not present

## 2021-03-11 DIAGNOSIS — I10 Essential (primary) hypertension: Secondary | ICD-10-CM | POA: Diagnosis not present

## 2021-03-11 DIAGNOSIS — K5900 Constipation, unspecified: Secondary | ICD-10-CM | POA: Diagnosis not present

## 2021-03-18 DIAGNOSIS — E1165 Type 2 diabetes mellitus with hyperglycemia: Secondary | ICD-10-CM | POA: Diagnosis not present

## 2021-03-18 DIAGNOSIS — I83811 Varicose veins of right lower extremities with pain: Secondary | ICD-10-CM | POA: Diagnosis not present

## 2021-03-18 DIAGNOSIS — M79662 Pain in left lower leg: Secondary | ICD-10-CM | POA: Diagnosis not present

## 2021-03-18 DIAGNOSIS — I10 Essential (primary) hypertension: Secondary | ICD-10-CM | POA: Diagnosis not present

## 2021-04-07 DIAGNOSIS — J069 Acute upper respiratory infection, unspecified: Secondary | ICD-10-CM | POA: Diagnosis not present

## 2021-04-07 DIAGNOSIS — R051 Acute cough: Secondary | ICD-10-CM | POA: Diagnosis not present

## 2021-04-07 DIAGNOSIS — Z20822 Contact with and (suspected) exposure to covid-19: Secondary | ICD-10-CM | POA: Diagnosis not present

## 2021-04-18 DIAGNOSIS — H43813 Vitreous degeneration, bilateral: Secondary | ICD-10-CM | POA: Diagnosis not present

## 2021-04-18 DIAGNOSIS — H5203 Hypermetropia, bilateral: Secondary | ICD-10-CM | POA: Diagnosis not present

## 2021-04-18 DIAGNOSIS — H524 Presbyopia: Secondary | ICD-10-CM | POA: Diagnosis not present

## 2021-04-18 DIAGNOSIS — D3132 Benign neoplasm of left choroid: Secondary | ICD-10-CM | POA: Diagnosis not present

## 2021-04-18 DIAGNOSIS — H52221 Regular astigmatism, right eye: Secondary | ICD-10-CM | POA: Diagnosis not present

## 2021-04-18 DIAGNOSIS — H2513 Age-related nuclear cataract, bilateral: Secondary | ICD-10-CM | POA: Diagnosis not present

## 2021-04-18 DIAGNOSIS — H35363 Drusen (degenerative) of macula, bilateral: Secondary | ICD-10-CM | POA: Diagnosis not present

## 2021-06-18 DIAGNOSIS — I10 Essential (primary) hypertension: Secondary | ICD-10-CM | POA: Diagnosis not present

## 2021-06-18 DIAGNOSIS — E782 Mixed hyperlipidemia: Secondary | ICD-10-CM | POA: Diagnosis not present

## 2021-07-12 DIAGNOSIS — Z6834 Body mass index (BMI) 34.0-34.9, adult: Secondary | ICD-10-CM | POA: Diagnosis not present

## 2021-07-12 DIAGNOSIS — E559 Vitamin D deficiency, unspecified: Secondary | ICD-10-CM | POA: Diagnosis not present

## 2021-07-12 DIAGNOSIS — Z79899 Other long term (current) drug therapy: Secondary | ICD-10-CM | POA: Diagnosis not present

## 2021-07-12 DIAGNOSIS — Z0000 Encounter for general adult medical examination without abnormal findings: Secondary | ICD-10-CM | POA: Diagnosis not present

## 2021-07-12 DIAGNOSIS — R5383 Other fatigue: Secondary | ICD-10-CM | POA: Diagnosis not present

## 2021-07-12 DIAGNOSIS — I10 Essential (primary) hypertension: Secondary | ICD-10-CM | POA: Diagnosis not present

## 2021-07-12 DIAGNOSIS — Z1331 Encounter for screening for depression: Secondary | ICD-10-CM | POA: Diagnosis not present

## 2021-07-12 DIAGNOSIS — Z7189 Other specified counseling: Secondary | ICD-10-CM | POA: Diagnosis not present

## 2021-07-12 DIAGNOSIS — Z299 Encounter for prophylactic measures, unspecified: Secondary | ICD-10-CM | POA: Diagnosis not present

## 2021-07-12 DIAGNOSIS — E7800 Pure hypercholesterolemia, unspecified: Secondary | ICD-10-CM | POA: Diagnosis not present

## 2021-07-12 DIAGNOSIS — Z789 Other specified health status: Secondary | ICD-10-CM | POA: Diagnosis not present

## 2021-07-16 ENCOUNTER — Other Ambulatory Visit: Payer: Self-pay | Admitting: Internal Medicine

## 2021-07-16 DIAGNOSIS — N644 Mastodynia: Secondary | ICD-10-CM

## 2021-07-31 ENCOUNTER — Other Ambulatory Visit: Payer: Medicare Other

## 2021-08-12 ENCOUNTER — Ambulatory Visit
Admission: RE | Admit: 2021-08-12 | Discharge: 2021-08-12 | Disposition: A | Discharge status: Home / Self Care | Payer: Medicare Other | Source: Ambulatory Visit | Attending: Internal Medicine | Admitting: Internal Medicine

## 2021-08-12 ENCOUNTER — Ambulatory Visit: Payer: Medicare Other

## 2021-08-12 ENCOUNTER — Other Ambulatory Visit: Payer: Self-pay

## 2021-08-12 DIAGNOSIS — N644 Mastodynia: Secondary | ICD-10-CM

## 2021-08-12 DIAGNOSIS — R922 Inconclusive mammogram: Secondary | ICD-10-CM | POA: Diagnosis not present

## 2021-08-19 DIAGNOSIS — K5900 Constipation, unspecified: Secondary | ICD-10-CM | POA: Diagnosis not present

## 2021-08-19 DIAGNOSIS — E7800 Pure hypercholesterolemia, unspecified: Secondary | ICD-10-CM | POA: Diagnosis not present

## 2021-08-19 DIAGNOSIS — Z6834 Body mass index (BMI) 34.0-34.9, adult: Secondary | ICD-10-CM | POA: Diagnosis not present

## 2021-08-19 DIAGNOSIS — Z299 Encounter for prophylactic measures, unspecified: Secondary | ICD-10-CM | POA: Diagnosis not present

## 2021-08-19 DIAGNOSIS — I10 Essential (primary) hypertension: Secondary | ICD-10-CM | POA: Diagnosis not present

## 2021-10-22 DIAGNOSIS — Z789 Other specified health status: Secondary | ICD-10-CM | POA: Diagnosis not present

## 2021-10-22 DIAGNOSIS — Z299 Encounter for prophylactic measures, unspecified: Secondary | ICD-10-CM | POA: Diagnosis not present

## 2021-10-22 DIAGNOSIS — I10 Essential (primary) hypertension: Secondary | ICD-10-CM | POA: Diagnosis not present

## 2021-10-22 DIAGNOSIS — E7800 Pure hypercholesterolemia, unspecified: Secondary | ICD-10-CM | POA: Diagnosis not present

## 2021-10-22 DIAGNOSIS — Z6834 Body mass index (BMI) 34.0-34.9, adult: Secondary | ICD-10-CM | POA: Diagnosis not present

## 2021-11-11 DIAGNOSIS — S90211A Contusion of right great toe with damage to nail, initial encounter: Secondary | ICD-10-CM | POA: Diagnosis not present

## 2022-01-30 DIAGNOSIS — I10 Essential (primary) hypertension: Secondary | ICD-10-CM | POA: Diagnosis not present

## 2022-01-30 DIAGNOSIS — Z23 Encounter for immunization: Secondary | ICD-10-CM | POA: Diagnosis not present

## 2022-01-30 DIAGNOSIS — Z6834 Body mass index (BMI) 34.0-34.9, adult: Secondary | ICD-10-CM | POA: Diagnosis not present

## 2022-01-30 DIAGNOSIS — Z299 Encounter for prophylactic measures, unspecified: Secondary | ICD-10-CM | POA: Diagnosis not present

## 2022-01-30 DIAGNOSIS — E7800 Pure hypercholesterolemia, unspecified: Secondary | ICD-10-CM | POA: Diagnosis not present

## 2022-01-30 DIAGNOSIS — G720 Drug-induced myopathy: Secondary | ICD-10-CM | POA: Diagnosis not present

## 2022-01-30 DIAGNOSIS — T466X5A Adverse effect of antihyperlipidemic and antiarteriosclerotic drugs, initial encounter: Secondary | ICD-10-CM | POA: Diagnosis not present

## 2022-02-18 DIAGNOSIS — R059 Cough, unspecified: Secondary | ICD-10-CM | POA: Diagnosis not present

## 2022-02-18 DIAGNOSIS — J069 Acute upper respiratory infection, unspecified: Secondary | ICD-10-CM | POA: Diagnosis not present

## 2022-02-18 DIAGNOSIS — Z299 Encounter for prophylactic measures, unspecified: Secondary | ICD-10-CM | POA: Diagnosis not present

## 2022-02-18 DIAGNOSIS — I10 Essential (primary) hypertension: Secondary | ICD-10-CM | POA: Diagnosis not present

## 2022-03-16 DIAGNOSIS — M546 Pain in thoracic spine: Secondary | ICD-10-CM | POA: Diagnosis not present

## 2022-03-16 DIAGNOSIS — L03312 Cellulitis of back [any part except buttock]: Secondary | ICD-10-CM | POA: Diagnosis not present

## 2022-03-18 DIAGNOSIS — T7840XA Allergy, unspecified, initial encounter: Secondary | ICD-10-CM | POA: Diagnosis not present

## 2022-03-18 DIAGNOSIS — Z6832 Body mass index (BMI) 32.0-32.9, adult: Secondary | ICD-10-CM | POA: Diagnosis not present

## 2022-03-18 DIAGNOSIS — I10 Essential (primary) hypertension: Secondary | ICD-10-CM | POA: Diagnosis not present

## 2022-03-18 DIAGNOSIS — E7800 Pure hypercholesterolemia, unspecified: Secondary | ICD-10-CM | POA: Diagnosis not present

## 2022-03-18 DIAGNOSIS — Z789 Other specified health status: Secondary | ICD-10-CM | POA: Diagnosis not present

## 2022-03-18 DIAGNOSIS — Z299 Encounter for prophylactic measures, unspecified: Secondary | ICD-10-CM | POA: Diagnosis not present

## 2022-03-23 ENCOUNTER — Emergency Department (HOSPITAL_COMMUNITY)
Admission: EM | Admit: 2022-03-23 | Discharge: 2022-03-23 | Disposition: A | Discharge status: Home / Self Care | Payer: Medicare Other | Attending: Emergency Medicine | Admitting: Emergency Medicine

## 2022-03-23 DIAGNOSIS — L989 Disorder of the skin and subcutaneous tissue, unspecified: Secondary | ICD-10-CM | POA: Insufficient documentation

## 2022-03-23 MED ORDER — NAPROXEN 375 MG PO TABS: 375.0000 mg | ORAL_TABLET | Freq: Two times a day (BID) | ORAL | 0 refills | Status: AC

## 2022-03-23 MED ORDER — DEXAMETHASONE SODIUM PHOSPHATE 10 MG/ML IJ SOLN
8.0000 mg | Freq: Once | INTRAMUSCULAR | Status: AC
  Administered 2022-03-23: 8 mg via INTRAMUSCULAR
  Filled 2022-03-23: qty 1

## 2022-03-23 MED ORDER — HYDROCORTISONE 1 % EX CREA: TOPICAL_CREAM | CUTANEOUS | 0 refills | Status: AC

## 2022-03-23 NOTE — Discharge Instructions (Addendum)
Apply the steroid cream as directed.  You may keep the area bandaged if needed to provide some padding to the area.  I have listed a dermatologist for you to arrange follow-up with if needed.

## 2022-03-23 NOTE — ED Provider Notes (Signed)
[facility] EMERGENCY DEPARTMENT Provider Note   CSN: [id number] Arrival date & time: 03/23/22  1300     History  Chief Complaint  Patient presents with   Cyst    Mallory Parrish is a 71 y.o. female.  HPI      Mallory Parrish is a 71 y.o. female who presents to the Emergency Department complaining of painful lesion to her mid back x1 week.  She was initially seen at urgent care and prescribed an antibiotic.  She states that she had allergic reaction that included facial swelling while taking cephalexin and was advised to discontinue.  She was then seen by her PCP and prescribed Claritin which she states she has been taking daily without improvement.  She complains of surrounding pain to the lesion.  No satellite lesions, fever, chills, itching or swelling.  She denies any drainage of the area.  She states that she believes she was bit by a spider as she brought some outdoor plants inside and believes there were spiders or some other type of insect in the plant.    Home Medications Prior to Admission medications   Not on File      Allergies    Codeine    Review of Systems   Review of Systems  Constitutional:  Negative for appetite change, chills and fever.  Respiratory:  Negative for shortness of breath.   Cardiovascular:  Negative for chest pain.  Gastrointestinal:  Negative for abdominal pain, nausea and vomiting.  Skin:  Positive for wound.       Painful lesion mid back  Neurological:  Negative for dizziness, weakness and numbness.    Physical Exam Updated Vital Signs BP (!) 136/57   Pulse 64   Temp 98.1 F (36.7 C) (Oral)   Resp 18   Ht '5\' 4"'$  (1.626 m)   Wt 81.6 kg   SpO2 97%   BMI 30.90 kg/m  Physical Exam Vitals and nursing note reviewed.  Constitutional:      General: She is not in acute distress.    Appearance: Normal appearance. She is not toxic-appearing.  Cardiovascular:     Rate and Rhythm: Normal rate and regular rhythm.     Pulses:  Normal pulses.  Pulmonary:     Effort: Pulmonary effort is normal.  Musculoskeletal:        General: No swelling.  Skin:    General: Skin is warm.     Capillary Refill: Capillary refill takes less than 2 seconds.     Findings: Lesion present.     Comments: 1 cm scaly appearing macular lesion to the mid back.  Some very mild surrounding erythema.  There is no fluctuance induration or edema.  No vesicles, pustules or satellite lesions.  Neurological:     General: No focal deficit present.     Mental Status: She is alert.     Sensory: No sensory deficit.     Motor: No weakness.     ED Results / Procedures / Treatments   Labs (all labs ordered are listed, but only abnormal results are displayed) Labs Reviewed - No data to display  EKG None  Radiology No results found.  Procedures Procedures    Medications Ordered in ED Medications  dexamethasone (DECADRON) injection 8 mg (has no administration in time range)    ED Course/ Medical Decision Making/ A&P  Medical Decision Making Patient here for evaluation of painful lesion to the mid back x1 week.  Was initially started on cephalexin and had facial swelling after taking the medication.  Medication was discontinued and she was started on Claritin.  She denies any improvement of her symptoms.  No reported chest pain or shortness of breath.  No edema difficulty swallowing or other rash or lesions of the skin.  On exam, there is a single 2 cm macular, scaly lesion of the mid back mild surrounding erythema without fluctuance, induration, vesicles or pustules.  There is no satellite lesions noted.  Amount and/or Complexity of Data Reviewed Discussion of management or test interpretation with external provider(s): Patient here with potential insect bite of the mid back.  There are no vesicles or satellite lesions to suggest zoster.  No fever or chills.  No concerning symptoms for cyst or abscess.  We will  treat with single IM dose of Decadron, steroid cream.  Feel that she is appropriate for discharge home  Risk Prescription drug management.           Final Clinical Impression(s) / ED Diagnoses Final diagnoses:  None    Rx / DC Orders ED Discharge Orders     None         Kem Parkinson, PA-C 03/23/22 1737    Milton Ferguson, MD 03/24/22 1229

## 2022-03-23 NOTE — ED Triage Notes (Signed)
Pt to ED c/o cyst to mid back x 1 week. Pt reports she has been evaluated twice for the same and dx with cyst. Started on abx, but did not finish d/t did not tolerate well.

## 2022-05-13 DIAGNOSIS — U071 COVID-19: Secondary | ICD-10-CM | POA: Diagnosis not present

## 2022-05-13 DIAGNOSIS — R509 Fever, unspecified: Secondary | ICD-10-CM | POA: Diagnosis not present

## 2022-05-13 DIAGNOSIS — I10 Essential (primary) hypertension: Secondary | ICD-10-CM | POA: Diagnosis not present

## 2022-05-13 DIAGNOSIS — R059 Cough, unspecified: Secondary | ICD-10-CM | POA: Diagnosis not present

## 2022-06-03 DIAGNOSIS — H7391 Unspecified disorder of tympanic membrane, right ear: Secondary | ICD-10-CM | POA: Diagnosis not present

## 2022-07-08 DIAGNOSIS — M545 Low back pain, unspecified: Secondary | ICD-10-CM | POA: Diagnosis not present

## 2022-07-08 DIAGNOSIS — M25552 Pain in left hip: Secondary | ICD-10-CM | POA: Diagnosis not present

## 2022-07-14 DIAGNOSIS — R5383 Other fatigue: Secondary | ICD-10-CM | POA: Diagnosis not present

## 2022-07-14 DIAGNOSIS — Z79899 Other long term (current) drug therapy: Secondary | ICD-10-CM | POA: Diagnosis not present

## 2022-07-14 DIAGNOSIS — Z6832 Body mass index (BMI) 32.0-32.9, adult: Secondary | ICD-10-CM | POA: Diagnosis not present

## 2022-07-14 DIAGNOSIS — I10 Essential (primary) hypertension: Secondary | ICD-10-CM | POA: Diagnosis not present

## 2022-07-14 DIAGNOSIS — E7800 Pure hypercholesterolemia, unspecified: Secondary | ICD-10-CM | POA: Diagnosis not present

## 2022-07-14 DIAGNOSIS — M542 Cervicalgia: Secondary | ICD-10-CM | POA: Diagnosis not present

## 2022-07-14 DIAGNOSIS — Z1339 Encounter for screening examination for other mental health and behavioral disorders: Secondary | ICD-10-CM | POA: Diagnosis not present

## 2022-07-14 DIAGNOSIS — Z299 Encounter for prophylactic measures, unspecified: Secondary | ICD-10-CM | POA: Diagnosis not present

## 2022-07-14 DIAGNOSIS — Z7189 Other specified counseling: Secondary | ICD-10-CM | POA: Diagnosis not present

## 2022-07-14 DIAGNOSIS — Z1331 Encounter for screening for depression: Secondary | ICD-10-CM | POA: Diagnosis not present

## 2022-07-14 DIAGNOSIS — Z0000 Encounter for general adult medical examination without abnormal findings: Secondary | ICD-10-CM | POA: Diagnosis not present

## 2022-07-14 DIAGNOSIS — E559 Vitamin D deficiency, unspecified: Secondary | ICD-10-CM | POA: Diagnosis not present

## 2022-07-22 ENCOUNTER — Other Ambulatory Visit: Payer: Self-pay | Admitting: Internal Medicine

## 2022-07-22 DIAGNOSIS — Z1231 Encounter for screening mammogram for malignant neoplasm of breast: Secondary | ICD-10-CM

## 2022-09-02 ENCOUNTER — Ambulatory Visit
Admission: RE | Admit: 2022-09-02 | Discharge: 2022-09-02 | Disposition: A | Discharge status: Home / Self Care | Payer: Medicare Other | Source: Ambulatory Visit | Attending: Internal Medicine | Admitting: Internal Medicine

## 2022-09-02 DIAGNOSIS — Z1231 Encounter for screening mammogram for malignant neoplasm of breast: Secondary | ICD-10-CM

## 2022-09-23 DIAGNOSIS — M7062 Trochanteric bursitis, left hip: Secondary | ICD-10-CM | POA: Diagnosis not present

## 2022-10-15 DIAGNOSIS — R059 Cough, unspecified: Secondary | ICD-10-CM | POA: Diagnosis not present

## 2022-10-15 DIAGNOSIS — R070 Pain in throat: Secondary | ICD-10-CM | POA: Diagnosis not present

## 2022-10-15 DIAGNOSIS — M1380 Other specified arthritis, unspecified site: Secondary | ICD-10-CM | POA: Diagnosis not present

## 2022-10-15 DIAGNOSIS — Z299 Encounter for prophylactic measures, unspecified: Secondary | ICD-10-CM | POA: Diagnosis not present

## 2022-10-16 DIAGNOSIS — U071 COVID-19: Secondary | ICD-10-CM | POA: Diagnosis not present

## 2022-10-16 DIAGNOSIS — R509 Fever, unspecified: Secondary | ICD-10-CM | POA: Diagnosis not present

## 2022-12-15 DIAGNOSIS — L03031 Cellulitis of right toe: Secondary | ICD-10-CM | POA: Diagnosis not present

## 2022-12-15 DIAGNOSIS — M79674 Pain in right toe(s): Secondary | ICD-10-CM | POA: Diagnosis not present

## 2022-12-29 DIAGNOSIS — L03039 Cellulitis of unspecified toe: Secondary | ICD-10-CM | POA: Diagnosis not present

## 2022-12-29 DIAGNOSIS — G5791 Unspecified mononeuropathy of right lower limb: Secondary | ICD-10-CM | POA: Diagnosis not present

## 2023-01-20 ENCOUNTER — Ambulatory Visit (HOSPITAL_BASED_OUTPATIENT_CLINIC_OR_DEPARTMENT_OTHER): Payer: Medicare Other | Admitting: Student

## 2023-02-17 DIAGNOSIS — M7062 Trochanteric bursitis, left hip: Secondary | ICD-10-CM | POA: Diagnosis not present

## 2023-04-28 DIAGNOSIS — M25552 Pain in left hip: Secondary | ICD-10-CM | POA: Diagnosis not present

## 2023-06-15 DIAGNOSIS — R109 Unspecified abdominal pain: Secondary | ICD-10-CM | POA: Diagnosis not present

## 2023-06-29 DIAGNOSIS — R102 Pelvic and perineal pain: Secondary | ICD-10-CM | POA: Diagnosis not present

## 2023-06-30 DIAGNOSIS — K5909 Other constipation: Secondary | ICD-10-CM | POA: Diagnosis not present

## 2023-06-30 DIAGNOSIS — R195 Other fecal abnormalities: Secondary | ICD-10-CM | POA: Diagnosis not present

## 2023-07-24 ENCOUNTER — Other Ambulatory Visit: Payer: Self-pay | Admitting: Internal Medicine

## 2023-07-24 DIAGNOSIS — Z1231 Encounter for screening mammogram for malignant neoplasm of breast: Secondary | ICD-10-CM

## 2023-07-28 DIAGNOSIS — M791 Myalgia, unspecified site: Secondary | ICD-10-CM | POA: Diagnosis not present

## 2023-07-28 DIAGNOSIS — M5442 Lumbago with sciatica, left side: Secondary | ICD-10-CM | POA: Diagnosis not present

## 2023-07-28 DIAGNOSIS — M25552 Pain in left hip: Secondary | ICD-10-CM | POA: Diagnosis not present

## 2023-08-03 DIAGNOSIS — M7602 Gluteal tendinitis, left hip: Secondary | ICD-10-CM | POA: Diagnosis not present

## 2023-08-03 DIAGNOSIS — M7062 Trochanteric bursitis, left hip: Secondary | ICD-10-CM | POA: Diagnosis not present

## 2023-08-03 DIAGNOSIS — M5136 Other intervertebral disc degeneration, lumbar region with discogenic back pain only: Secondary | ICD-10-CM | POA: Diagnosis not present

## 2023-08-05 DIAGNOSIS — M5136 Other intervertebral disc degeneration, lumbar region with discogenic back pain only: Secondary | ICD-10-CM | POA: Diagnosis not present

## 2023-08-05 DIAGNOSIS — M7602 Gluteal tendinitis, left hip: Secondary | ICD-10-CM | POA: Diagnosis not present

## 2023-08-05 DIAGNOSIS — M7062 Trochanteric bursitis, left hip: Secondary | ICD-10-CM | POA: Diagnosis not present

## 2023-08-11 DIAGNOSIS — M5136 Other intervertebral disc degeneration, lumbar region with discogenic back pain only: Secondary | ICD-10-CM | POA: Diagnosis not present

## 2023-08-11 DIAGNOSIS — M7602 Gluteal tendinitis, left hip: Secondary | ICD-10-CM | POA: Diagnosis not present

## 2023-08-11 DIAGNOSIS — M7062 Trochanteric bursitis, left hip: Secondary | ICD-10-CM | POA: Diagnosis not present

## 2023-08-14 DIAGNOSIS — M5136 Other intervertebral disc degeneration, lumbar region with discogenic back pain only: Secondary | ICD-10-CM | POA: Diagnosis not present

## 2023-08-14 DIAGNOSIS — M7062 Trochanteric bursitis, left hip: Secondary | ICD-10-CM | POA: Diagnosis not present

## 2023-08-14 DIAGNOSIS — M7602 Gluteal tendinitis, left hip: Secondary | ICD-10-CM | POA: Diagnosis not present

## 2023-08-18 DIAGNOSIS — M7602 Gluteal tendinitis, left hip: Secondary | ICD-10-CM | POA: Diagnosis not present

## 2023-08-18 DIAGNOSIS — M5136 Other intervertebral disc degeneration, lumbar region with discogenic back pain only: Secondary | ICD-10-CM | POA: Diagnosis not present

## 2023-08-18 DIAGNOSIS — M7062 Trochanteric bursitis, left hip: Secondary | ICD-10-CM | POA: Diagnosis not present

## 2023-08-21 DIAGNOSIS — M7602 Gluteal tendinitis, left hip: Secondary | ICD-10-CM | POA: Diagnosis not present

## 2023-08-21 DIAGNOSIS — M5136 Other intervertebral disc degeneration, lumbar region with discogenic back pain only: Secondary | ICD-10-CM | POA: Diagnosis not present

## 2023-08-21 DIAGNOSIS — M7062 Trochanteric bursitis, left hip: Secondary | ICD-10-CM | POA: Diagnosis not present

## 2023-08-25 DIAGNOSIS — M7602 Gluteal tendinitis, left hip: Secondary | ICD-10-CM | POA: Diagnosis not present

## 2023-08-25 DIAGNOSIS — M7062 Trochanteric bursitis, left hip: Secondary | ICD-10-CM | POA: Diagnosis not present

## 2023-08-25 DIAGNOSIS — M5136 Other intervertebral disc degeneration, lumbar region with discogenic back pain only: Secondary | ICD-10-CM | POA: Diagnosis not present

## 2023-08-27 DIAGNOSIS — M7602 Gluteal tendinitis, left hip: Secondary | ICD-10-CM | POA: Diagnosis not present

## 2023-08-27 DIAGNOSIS — M5136 Other intervertebral disc degeneration, lumbar region with discogenic back pain only: Secondary | ICD-10-CM | POA: Diagnosis not present

## 2023-08-27 DIAGNOSIS — M7062 Trochanteric bursitis, left hip: Secondary | ICD-10-CM | POA: Diagnosis not present

## 2023-09-07 ENCOUNTER — Ambulatory Visit
Admission: RE | Admit: 2023-09-07 | Discharge: 2023-09-07 | Disposition: A | Discharge status: Home / Self Care | Source: Ambulatory Visit | Attending: Internal Medicine | Admitting: Internal Medicine

## 2023-09-07 DIAGNOSIS — Z1231 Encounter for screening mammogram for malignant neoplasm of breast: Secondary | ICD-10-CM | POA: Diagnosis not present

## 2023-09-21 DIAGNOSIS — D251 Intramural leiomyoma of uterus: Secondary | ICD-10-CM | POA: Diagnosis not present

## 2023-12-29 DIAGNOSIS — N760 Acute vaginitis: Secondary | ICD-10-CM | POA: Diagnosis not present

## 2023-12-29 DIAGNOSIS — N390 Urinary tract infection, site not specified: Secondary | ICD-10-CM | POA: Diagnosis not present

## 2023-12-29 DIAGNOSIS — R102 Pelvic and perineal pain: Secondary | ICD-10-CM | POA: Diagnosis not present

## 2023-12-29 DIAGNOSIS — N898 Other specified noninflammatory disorders of vagina: Secondary | ICD-10-CM | POA: Diagnosis not present

## 2023-12-29 DIAGNOSIS — R319 Hematuria, unspecified: Secondary | ICD-10-CM | POA: Diagnosis not present

## 2024-01-14 DIAGNOSIS — E785 Hyperlipidemia, unspecified: Secondary | ICD-10-CM | POA: Diagnosis not present

## 2024-01-14 DIAGNOSIS — Z131 Encounter for screening for diabetes mellitus: Secondary | ICD-10-CM | POA: Diagnosis not present

## 2024-01-14 DIAGNOSIS — Z0000 Encounter for general adult medical examination without abnormal findings: Secondary | ICD-10-CM | POA: Diagnosis not present

## 2024-01-14 DIAGNOSIS — M79662 Pain in left lower leg: Secondary | ICD-10-CM | POA: Diagnosis not present

## 2024-01-14 DIAGNOSIS — R7309 Other abnormal glucose: Secondary | ICD-10-CM | POA: Diagnosis not present

## 2024-01-14 DIAGNOSIS — R5383 Other fatigue: Secondary | ICD-10-CM | POA: Diagnosis not present

## 2024-01-15 DIAGNOSIS — I10 Essential (primary) hypertension: Secondary | ICD-10-CM | POA: Diagnosis not present

## 2024-01-15 DIAGNOSIS — R7303 Prediabetes: Secondary | ICD-10-CM | POA: Diagnosis not present

## 2024-01-15 DIAGNOSIS — E785 Hyperlipidemia, unspecified: Secondary | ICD-10-CM | POA: Diagnosis not present

## 2024-01-22 DIAGNOSIS — R7303 Prediabetes: Secondary | ICD-10-CM | POA: Diagnosis not present

## 2024-02-11 DIAGNOSIS — R7303 Prediabetes: Secondary | ICD-10-CM | POA: Diagnosis not present

## 2024-02-11 DIAGNOSIS — I10 Essential (primary) hypertension: Secondary | ICD-10-CM | POA: Diagnosis not present

## 2024-02-11 DIAGNOSIS — E785 Hyperlipidemia, unspecified: Secondary | ICD-10-CM | POA: Diagnosis not present

## 2024-02-12 DIAGNOSIS — J00 Acute nasopharyngitis [common cold]: Secondary | ICD-10-CM | POA: Diagnosis not present

## 2024-02-12 DIAGNOSIS — R051 Acute cough: Secondary | ICD-10-CM | POA: Diagnosis not present

## 2024-02-23 DIAGNOSIS — M7062 Trochanteric bursitis, left hip: Secondary | ICD-10-CM | POA: Diagnosis not present

## 2024-02-23 DIAGNOSIS — M533 Sacrococcygeal disorders, not elsewhere classified: Secondary | ICD-10-CM | POA: Diagnosis not present

## 2024-03-14 DIAGNOSIS — I10 Essential (primary) hypertension: Secondary | ICD-10-CM | POA: Diagnosis not present

## 2024-03-14 DIAGNOSIS — R7303 Prediabetes: Secondary | ICD-10-CM | POA: Diagnosis not present

## 2024-03-14 DIAGNOSIS — E785 Hyperlipidemia, unspecified: Secondary | ICD-10-CM | POA: Diagnosis not present

## 2024-03-22 DIAGNOSIS — H3550 Unspecified hereditary retinal dystrophy: Secondary | ICD-10-CM | POA: Diagnosis not present

## 2024-03-22 DIAGNOSIS — H2513 Age-related nuclear cataract, bilateral: Secondary | ICD-10-CM | POA: Diagnosis not present

## 2024-03-22 DIAGNOSIS — H524 Presbyopia: Secondary | ICD-10-CM | POA: Diagnosis not present

## 2024-03-22 DIAGNOSIS — H5203 Hypermetropia, bilateral: Secondary | ICD-10-CM | POA: Diagnosis not present

## 2024-03-22 DIAGNOSIS — H40013 Open angle with borderline findings, low risk, bilateral: Secondary | ICD-10-CM | POA: Diagnosis not present

## 2024-04-04 DIAGNOSIS — R7303 Prediabetes: Secondary | ICD-10-CM | POA: Diagnosis not present

## 2024-04-04 DIAGNOSIS — I10 Essential (primary) hypertension: Secondary | ICD-10-CM | POA: Diagnosis not present

## 2024-04-04 DIAGNOSIS — E785 Hyperlipidemia, unspecified: Secondary | ICD-10-CM | POA: Diagnosis not present

## 2024-04-25 DIAGNOSIS — R051 Acute cough: Secondary | ICD-10-CM | POA: Diagnosis not present

## 2024-04-25 DIAGNOSIS — J069 Acute upper respiratory infection, unspecified: Secondary | ICD-10-CM | POA: Diagnosis not present

## 2024-05-10 ENCOUNTER — Emergency Department (HOSPITAL_COMMUNITY)
Admission: EM | Admit: 2024-05-10 | Discharge: 2024-05-11 | Disposition: A | Discharge status: Home / Self Care | Attending: Emergency Medicine | Admitting: Emergency Medicine

## 2024-05-10 ENCOUNTER — Encounter (HOSPITAL_COMMUNITY): Payer: Self-pay

## 2024-05-10 ENCOUNTER — Other Ambulatory Visit: Payer: Self-pay

## 2024-05-10 ENCOUNTER — Emergency Department (HOSPITAL_COMMUNITY)

## 2024-05-10 DIAGNOSIS — I10 Essential (primary) hypertension: Secondary | ICD-10-CM | POA: Diagnosis not present

## 2024-05-10 DIAGNOSIS — J101 Influenza due to other identified influenza virus with other respiratory manifestations: Secondary | ICD-10-CM | POA: Insufficient documentation

## 2024-05-10 DIAGNOSIS — N3001 Acute cystitis with hematuria: Secondary | ICD-10-CM | POA: Insufficient documentation

## 2024-05-10 DIAGNOSIS — J111 Influenza due to unidentified influenza virus with other respiratory manifestations: Secondary | ICD-10-CM

## 2024-05-10 DIAGNOSIS — R059 Cough, unspecified: Secondary | ICD-10-CM | POA: Diagnosis present

## 2024-05-10 LAB — CBC WITH DIFFERENTIAL/PLATELET
Abs Immature Granulocytes: 0.04 K/uL (ref 0.00–0.07)
Basophils Absolute: 0.1 K/uL (ref 0.0–0.1)
Basophils Relative: 1 %
Eosinophils Absolute: 0.1 K/uL (ref 0.0–0.5)
Eosinophils Relative: 1 %
HCT: 39.1 % (ref 36.0–46.0)
Hemoglobin: 12.6 g/dL (ref 12.0–15.0)
Immature Granulocytes: 0 %
Lymphocytes Relative: 4 %
Lymphs Abs: 0.4 K/uL — ABNORMAL LOW (ref 0.7–4.0)
MCH: 27.9 pg (ref 26.0–34.0)
MCHC: 32.2 g/dL (ref 30.0–36.0)
MCV: 86.5 fL (ref 80.0–100.0)
Monocytes Absolute: 0.8 K/uL (ref 0.1–1.0)
Monocytes Relative: 8 %
Neutro Abs: 8.4 K/uL — ABNORMAL HIGH (ref 1.7–7.7)
Neutrophils Relative %: 86 %
Platelets: 331 K/uL (ref 150–400)
RBC: 4.52 MIL/uL (ref 3.87–5.11)
RDW: 14.6 % (ref 11.5–15.5)
WBC: 9.7 K/uL (ref 4.0–10.5)
nRBC: 0 % (ref 0.0–0.2)

## 2024-05-10 LAB — RESP PANEL BY RT-PCR (RSV, FLU A&B, COVID)  RVPGX2
Influenza A by PCR: POSITIVE — AB
Influenza B by PCR: NEGATIVE
Resp Syncytial Virus by PCR: NEGATIVE
SARS Coronavirus 2 by RT PCR: NEGATIVE

## 2024-05-10 LAB — BASIC METABOLIC PANEL WITH GFR
Anion gap: 10 (ref 5–15)
BUN: 13 mg/dL (ref 8–23)
CO2: 28 mmol/L (ref 22–32)
Calcium: 9.8 mg/dL (ref 8.9–10.3)
Chloride: 101 mmol/L (ref 98–111)
Creatinine, Ser: 0.84 mg/dL (ref 0.44–1.00)
GFR, Estimated: >60 mL/min
Glucose, Bld: 105 mg/dL — ABNORMAL HIGH (ref 70–99)
Potassium: 3.8 mmol/L (ref 3.5–5.1)
Sodium: 138 mmol/L (ref 135–145)

## 2024-05-10 LAB — TROPONIN T, HIGH SENSITIVITY
Troponin T High Sensitivity: 20 ng/L — ABNORMAL HIGH (ref 0–19)
Troponin T High Sensitivity: 20 ng/L — ABNORMAL HIGH (ref 0–19)

## 2024-05-10 LAB — MAGNESIUM: Magnesium: 2 mg/dL (ref 1.7–2.4)

## 2024-05-10 MED ORDER — ACETAMINOPHEN 500 MG PO TABS
1000.0000 mg | ORAL_TABLET | Freq: Once | ORAL | Status: AC
  Administered 2024-05-10: 1000 mg via ORAL
  Filled 2024-05-10: qty 2

## 2024-05-10 MED ORDER — ASPIRIN 81 MG PO CHEW
324.0000 mg | CHEWABLE_TABLET | Freq: Once | ORAL | Status: AC
  Administered 2024-05-10: 324 mg via ORAL
  Filled 2024-05-10: qty 4

## 2024-05-10 NOTE — ED Provider Triage Note (Addendum)
 Emergency Medicine Provider Triage Evaluation Note  Mallory Parrish , a 73 y.o. female  was evaluated in triage.  Pt complains of cough, headache, shortness of breath that started yesterday.  Went to urgent care and had negative viral respiratory panel.  Was recommended coming to the emergency department.  Review of Systems  Positive: As above Negative: As above  Physical Exam  BP (!) 165/75 (BP Location: Right Arm)   Pulse (!) 107   Temp 98.9 F (37.2 C)   Resp 16   Ht 5' 4 (1.626 m)   Wt 83.5 kg   SpO2 96%   BMI 31.58 kg/m  Gen:   Awake, no distress   Resp:  Normal effort MSK:   Moves extremities without difficulty  Other:    Medical Decision Making  Medically screening exam initiated at 4:13 PM.  Appropriate orders placed.  Mallory Parrish was informed that the remainder of the evaluation will be completed by another provider, this initial triage assessment does not replace that evaluation, and the importance of remaining in the ED until their evaluation is complete.  Notified patient has a fever and her troponin is elevated and that she is not feeling well.  Advised nursing that patient should be brought to a room if she is not feeling well.  Tylenol  and aspirin  ordered.   Hildegard Loge, PA-C 05/10/24 1614    Hildegard Loge, PA-C 05/10/24 2142

## 2024-05-10 NOTE — ED Triage Notes (Signed)
 Pt is here for evaluation of cough which began last pm.  Cough is productive and pt has severe HA with this. Pt was seen at [facility] and covid and flu was negative and she was told to come to ED for further evaluation.

## 2024-05-10 NOTE — ED Provider Notes (Incomplete)
 73 year old female with history of hypertension, hyperlipidemia at onset yesterday of cough, fever, body aches and is now complaining of left lower quadrant pain.  Exam is benign.  Troponin is minimally elevated and stable and likely represents demand ischemia.  She is pending CT scan of abdomen and pelvis.

## 2024-05-10 NOTE — ED Provider Notes (Signed)
 " [facility] Provider Note   CSN: [id number] Arrival date & time: 05/10/24  1529     Patient presents with: Cough   Mallory Parrish is a 73 y.o. female with past medical history significant for hypertension, hyperlipidemia who presents concern for cough since last night, headache, fever, body aches.  Was seen at urgent care prior to arrival and had negative test for COVID, flu at that time.  She endorses some lower belly and low back pain.  She denies any nausea, vomiting, diarrhea.    Cough      Prior to Admission medications  Medication Sig Start Date End Date Taking? Authorizing Provider  cefadroxil  (DURICEF) 500 MG capsule Take 1 capsule (500 mg total) by mouth 2 (two) times daily. 05/11/24  Yes Ladye Macnaughton H, PA-C  ondansetron  (ZOFRAN -ODT) 4 MG disintegrating tablet Take 1 tablet (4 mg total) by mouth every 6 (six) hours as needed for nausea or vomiting. 05/11/24  Yes Loreley Schwall H, PA-C  oseltamivir  (TAMIFLU ) 75 MG capsule Take 1 capsule (75 mg total) by mouth every 12 (twelve) hours. 05/11/24  Yes Perline Awe H, PA-C  hydrocortisone  cream 1 % Apply to affected area 2 times daily 03/23/22   Triplett, Tammy, PA-C  naproxen  (NAPROSYN ) 375 MG tablet Take 1 tablet (375 mg total) by mouth 2 (two) times daily with a meal. 03/23/22   Triplett, Tammy, PA-C    Allergies: Codeine    Review of Systems  Respiratory:  Positive for cough.   All other systems reviewed and are negative.   Updated Vital Signs BP (!) 141/61 (BP Location: Right Arm)   Pulse 76   Temp 98.5 F (36.9 C) (Oral)   Resp 16   Ht 5' 4 (1.626 m)   Wt 83.5 kg   SpO2 100%   BMI 31.58 kg/m   Physical Exam Vitals and nursing note reviewed.  Constitutional:      General: She is not in acute distress.    Appearance: Normal appearance.  HENT:     Head: Normocephalic and atraumatic.  Eyes:     General:        Right eye: No discharge.         Left eye: No discharge.  Cardiovascular:     Rate and Rhythm: Normal rate and regular rhythm.     Heart sounds: No murmur heard.    No friction rub. No gallop.  Pulmonary:     Effort: Pulmonary effort is normal.     Breath sounds: Normal breath sounds.     Comments: No wheezing, rhonchi, stridor, rales Abdominal:     General: Bowel sounds are normal.     Palpations: Abdomen is soft.     Comments: Patient does have some focal tenderness in the left lower quadrant, no rebound, rigidity, very mild guarding.  Skin:    General: Skin is warm and dry.     Capillary Refill: Capillary refill takes less than 2 seconds.  Neurological:     Mental Status: She is alert and oriented to person, place, and time.  Psychiatric:        Mood and Affect: Mood normal.        Behavior: Behavior normal.     (all labs ordered are listed, but only abnormal results are displayed) Labs Reviewed  RESP PANEL BY RT-PCR (RSV, FLU A&B, COVID)  RVPGX2 - Abnormal; Notable for the following components:      Result Value  Influenza A by PCR POSITIVE (*)    All other components within normal limits  BASIC METABOLIC PANEL WITH GFR - Abnormal; Notable for the following components:   Glucose, Bld 105 (*)    All other components within normal limits  CBC WITH DIFFERENTIAL/PLATELET - Abnormal; Notable for the following components:   Neutro Abs 8.4 (*)    Lymphs Abs 0.4 (*)    All other components within normal limits  URINALYSIS, ROUTINE W REFLEX MICROSCOPIC - Abnormal; Notable for the following components:   APPearance HAZY (*)    Hgb urine dipstick SMALL (*)    Protein, ur 30 (*)    Leukocytes,Ua LARGE (*)    Bacteria, UA RARE (*)    All other components within normal limits  TROPONIN T, HIGH SENSITIVITY - Abnormal; Notable for the following components:   Troponin T High Sensitivity 20 (*)    All other components within normal limits  TROPONIN T, HIGH SENSITIVITY - Abnormal; Notable for the following  components:   Troponin T High Sensitivity 20 (*)    All other components within normal limits  URINE CULTURE  MAGNESIUM    EKG: EKG Interpretation Date/Time:  Tuesday May 10 2024 16:28:32 EST Ventricular Rate:  96 PR Interval:  154 QRS Duration:  88 QT Interval:  358 QTC Calculation: 452 R Axis:   41  Text Interpretation: Normal sinus rhythm Normal ECG No previous ECGs available Confirmed by Raford Lenis ([id number]) on 05/10/2024 11:34:24 PM  Radiology: CT ABDOMEN PELVIS W CONTRAST Result Date: 05/11/2024 EXAM: CT ABDOMEN AND PELVIS WITH CONTRAST 05/11/2024 12:11:00 AM TECHNIQUE: CT of the abdomen and pelvis was performed with the administration of intravenous contrast. 75 mL of iohexol  (OMNIPAQUE ) 350 MG/ML injection was administered. Multiplanar reformatted images are provided for review. Automated exposure control, iterative reconstruction, and/or weight-based adjustment of the mA/kV was utilized to reduce the radiation dose to as low as reasonably achievable. COMPARISON: None available. CLINICAL HISTORY: Abdominal pain, acute, nonlocalized. FINDINGS: LOWER CHEST: No acute abnormality. LIVER: The liver is unremarkable. GALLBLADDER AND BILE DUCTS: Gallbladder is unremarkable. No biliary ductal dilatation. SPLEEN: No acute abnormality. PANCREAS: No acute abnormality. ADRENAL GLANDS: No acute abnormality. KIDNEYS, URETERS AND BLADDER: No stones in the kidneys or ureters. No hydronephrosis. No perinephric or periureteral stranding. Urinary bladder is unremarkable. GI AND BOWEL: Stomach demonstrates no acute abnormality. There is no bowel obstruction. PERITONEUM AND RETROPERITONEUM: No ascites. No free air. VASCULATURE: Aortic atherosclerotic calcification. Aorta is normal in caliber. LYMPH NODES: No lymphadenopathy. REPRODUCTIVE ORGANS: No acute abnormality. Fibroid uterus. BONES AND SOFT TISSUES: No acute osseous abnormality. No focal soft tissue abnormality. IMPRESSION: 1. No acute findings  in the abdomen or pelvis. Electronically signed by: Norman Gatlin MD 05/11/2024 12:30 AM EST RP Workstation: [id number]   DG Chest 2 View Result Date: 05/10/2024 CLINICAL DATA:  Chest pain. EXAM: CHEST - 2 VIEW COMPARISON:  None Available. FINDINGS: No focal consolidation, pleural effusion or pneumothorax. Cardiac silhouette is within normal limits. No acute osseous pathology. IMPRESSION: No active cardiopulmonary disease. Electronically Signed   By: Vanetta Chou M.D.   On: 05/10/2024 18:33     Procedures   Medications Ordered in the ED  oseltamivir  (TAMIFLU ) capsule 75 mg (has no administration in time range)  cephALEXin  (KEFLEX ) capsule 500 mg (has no administration in time range)  acetaminophen  (TYLENOL ) tablet 1,000 mg (1,000 mg Oral Given 05/10/24 2150)  aspirin  chewable tablet 324 mg (324 mg Oral Given 05/10/24 2150)  iohexol  (OMNIPAQUE ) 350 MG/ML injection  75 mL (75 mLs Intravenous Contrast Given 05/11/24 0012)                                    Medical Decision Making Amount and/or Complexity of Data Reviewed Labs: ordered. Radiology: ordered.  Risk Prescription drug management.   This patient is a 73 y.o. female  who presents to the ED for concern of cough, fever, bodyaches, headache   Differential diagnoses prior to evaluation: The emergent differential diagnosis includes, but is not limited to,  Stroke, increased ICP, meningitis, CVA, intracranial tumor, venous sinus thrombosis, migraine, cluster headache, hypertension, drug related, head injury, tension headache, sinusitis, dental abscess, otitis media, TMJ, flu, pneumonia, UTI, intra-abdominal infection,. This is not an exhaustive differential.   Past Medical History / Co-morbidities / Social History: hypertension, hyperlipidemia  Physical Exam: Physical exam performed. The pertinent findings include: On ED arrival with some tachycardia, maximal heart rate at 107.  Some hypertension, blood pressure 165/75 on  arrival.  She did briefly have a fever of 101.6 which improved after Tylenol  in the emergency department.  No significant wheezing, rhonchi, stridor, rales, she does have some focal lower quadrant tenderness with no rebound, rigidity, some very mild guarding.  Normal bowel sounds throughout.  Normal breath sounds, normal respiratory effort, no wheezing, rhonchi, stridor, rales.  No focal consolidation noted.  Lab Tests/Imaging studies: I personally interpreted labs/imaging and the pertinent results include: CBC unremarkable other than mildly elevated neutrophil count with no leukocytosis.  BMP overall unremarkable.  Troponin mildly elevated at 20 but flat, suspect demand ischemia secondary to tachycardia.  Normal magnesium.  Her RVP is positive for the flu.  UA does show large leukocytes, 21-50 white blood cells and rare bacteria.  Given that she does have some lower abdominal pain we will treat for urinary tract infection in addition to the fluid.  Only 1 to 2 days of symptoms, we will plan to give Tamiflu .  CT abdomen pelvis, plain, chest x-ray with no evidence of acute intra-abdominal or intra thoracic abnormality. I agree with the radiologist interpretation.  Cardiac monitoring: EKG obtained and interpreted by myself and attending physician which shows: NSR, no acute ST-T Changes   Medications: I ordered medication including Tamiflu , Tylenol , Keflex  for UTI, will send urine for culture.  I have reviewed the patients home medicines and have made adjustments as needed.   Disposition: After consideration of the diagnostic results and the patients response to treatment, I feel that patient with stable vitals, resolution of tachycardia after fever was controlled, symptoms are consistent with flu with early UTI, plan for discharge with medications as above, encourage close follow-up.   emergency department workup does not suggest an emergent condition requiring admission or immediate intervention beyond  what has been performed at this time. The plan is: as above. The patient is safe for discharge and has been instructed to return immediately for worsening symptoms, change in symptoms or any other concerns.   Final diagnoses:  Flu  Acute cystitis with hematuria    ED Discharge Orders          Ordered    ondansetron  (ZOFRAN -ODT) 4 MG disintegrating tablet  Every 6 hours PRN        05/11/24 0112    cefadroxil  (DURICEF) 500 MG capsule  2 times daily        05/11/24 0112    oseltamivir  (TAMIFLU ) 75 MG capsule  Every 12  hours        05/11/24 0125               Rosan Sherlean VEAR DEVONNA 05/11/24 0125    Raford Lenis, MD 05/11/24 [id number]  "

## 2024-05-11 ENCOUNTER — Emergency Department (HOSPITAL_COMMUNITY)

## 2024-05-11 DIAGNOSIS — J101 Influenza due to other identified influenza virus with other respiratory manifestations: Secondary | ICD-10-CM | POA: Diagnosis not present

## 2024-05-11 LAB — URINALYSIS, ROUTINE W REFLEX MICROSCOPIC
Bilirubin Urine: NEGATIVE
Glucose, UA: NEGATIVE mg/dL
Ketones, ur: NEGATIVE mg/dL
Nitrite: NEGATIVE
Protein, ur: 30 mg/dL — AB
Specific Gravity, Urine: 1.026 (ref 1.005–1.030)
pH: 6 (ref 5.0–8.0)

## 2024-05-11 MED ORDER — ONDANSETRON 4 MG PO TBDP: 4.0000 mg | ORAL_TABLET | Freq: Four times a day (QID) | ORAL | 0 refills | Status: AC | PRN

## 2024-05-11 MED ORDER — IOHEXOL 350 MG/ML SOLN
75.0000 mL | Freq: Once | INTRAVENOUS | Status: AC | PRN
  Administered 2024-05-11: 75 mL via INTRAVENOUS

## 2024-05-11 MED ORDER — OSELTAMIVIR PHOSPHATE 75 MG PO CAPS: 75.0000 mg | ORAL_CAPSULE | Freq: Two times a day (BID) | ORAL | 0 refills | Status: AC

## 2024-05-11 MED ORDER — CEPHALEXIN 250 MG PO CAPS
500.0000 mg | ORAL_CAPSULE | Freq: Once | ORAL | Status: AC
  Administered 2024-05-11: 500 mg via ORAL
  Filled 2024-05-11: qty 2

## 2024-05-11 MED ORDER — CEFADROXIL 500 MG PO CAPS: 500.0000 mg | ORAL_CAPSULE | Freq: Two times a day (BID) | ORAL | 0 refills | Status: AC

## 2024-05-11 MED ORDER — OSELTAMIVIR PHOSPHATE 75 MG PO CAPS
75.0000 mg | ORAL_CAPSULE | Freq: Once | ORAL | Status: AC
  Administered 2024-05-11: 75 mg via ORAL
  Filled 2024-05-11: qty 1

## 2024-05-11 NOTE — Discharge Instructions (Signed)
You have the flu this is a viral infection that will likely start to improve after 7-10 days, antibiotics are not helpful in treating viral infections.  You may take Tamiflu twice a day for the next 5 days this will help shorten the duration of the flu and prevent complications, this medication can cause some upset stomach, nausea and diarrhea. Please make sure you are drinking plenty of fluids. You can treat your symptoms supportively with tylenol 650 mg/'1000mg'$  and ibuprofen 600 mg every 6 hours for fevers and pains. For nasal congestion you can use Zyrtec and Flonase to help with nasal congestion. To treat cough you can use over the counter cough medications such as Mucinex DM or Robitussin and throat lozenges. If your symptoms are not improving please follow up with you Primary doctor.   If you develop persistent fevers, shortness of breath or difficulty breathing, chest pain, severe headache and neck pain, persistent nausea and vomiting or other new or concerning symptoms return to the Emergency department.

## 2024-05-12 LAB — URINE CULTURE: Culture: 60000 — AB

## 2024-05-13 ENCOUNTER — Telehealth (HOSPITAL_BASED_OUTPATIENT_CLINIC_OR_DEPARTMENT_OTHER): Payer: Self-pay

## 2024-05-13 NOTE — Telephone Encounter (Signed)
 Post ED Visit - Positive Culture Follow-up  Culture report reviewed by antimicrobial stewardship pharmacist: [facility] [x]  Leonor Bash, [place].D. []  Venetia Gully, Pharm.D., BCPS AQ-ID []  Garrel Crews, Pharm.D., BCPS []  Almarie Lunger, Pharm.D., BCPS []  [place], [address].D., BCPS, AAHIVP []  Rosaline Bihari, Pharm.D., BCPS, AAHIVP []  Vernell Meier, PharmD, BCPS []  Latanya Hint, PharmD, BCPS []  Donald Medley, PharmD, BCPS []  Rocky Bold, PharmD []  Dorothyann Alert, PharmD, BCPS []  Morene Babe, PharmD  [facility] []  Rosaline Edison, PharmD []  Romona Bliss, PharmD []  Dolphus Roller, PharmD []  Veva Seip, Rph []  Vernell Daunt) Leonce, PharmD []  Eva Allis, PharmD []  Rosaline Millet, PharmD []  Iantha Batch, PharmD []  Arvin Gauss, PharmD []  Wanda Hasting, PharmD []  Ronal Rav, PharmD []  Rocky Slade, PharmD []  Bard Jeans, PharmD   Positive urine culture Treated with Cefadroxil , should offer empiric coverage and no further patient follow-up is required at this time.  Mallory Parrish 05/13/2024, 9:10 AM
